# Patient Record
Sex: Male | Born: 1965 | Hispanic: No | Marital: Married | Smoking: Never smoker
Health system: Southern US, Community
[De-identification: ages and names within clinical notes are randomized; demographics above are authoritative.]

## PROBLEM LIST (undated history)

## (undated) DIAGNOSIS — N200 Calculus of kidney: Secondary | ICD-10-CM

## (undated) DIAGNOSIS — E119 Type 2 diabetes mellitus without complications: Secondary | ICD-10-CM

## (undated) DIAGNOSIS — I10 Essential (primary) hypertension: Secondary | ICD-10-CM

## (undated) DIAGNOSIS — E7800 Pure hypercholesterolemia, unspecified: Secondary | ICD-10-CM

## (undated) HISTORY — PX: BACK SURGERY: SHX140

---

## 2000-04-29 ENCOUNTER — Ambulatory Visit (HOSPITAL_BASED_OUTPATIENT_CLINIC_OR_DEPARTMENT_OTHER): Admission: RE | Admit: 2000-04-29 | Discharge: 2000-04-29 | Payer: Self-pay | Admitting: Urology

## 2002-02-18 ENCOUNTER — Encounter: Payer: Self-pay | Admitting: Internal Medicine

## 2002-02-18 ENCOUNTER — Encounter: Admission: RE | Admit: 2002-02-18 | Discharge: 2002-02-18 | Payer: Self-pay | Admitting: Internal Medicine

## 2006-07-05 ENCOUNTER — Ambulatory Visit: Payer: Self-pay | Admitting: Family Medicine

## 2006-07-26 ENCOUNTER — Ambulatory Visit: Payer: Self-pay | Admitting: Family Medicine

## 2006-08-24 ENCOUNTER — Ambulatory Visit: Payer: Self-pay | Admitting: Oncology

## 2006-10-03 ENCOUNTER — Ambulatory Visit: Payer: Self-pay | Admitting: Internal Medicine

## 2006-10-09 ENCOUNTER — Ambulatory Visit: Payer: Self-pay | Admitting: Oncology

## 2006-10-31 ENCOUNTER — Encounter (INDEPENDENT_AMBULATORY_CARE_PROVIDER_SITE_OTHER): Payer: Self-pay | Admitting: *Deleted

## 2006-10-31 ENCOUNTER — Ambulatory Visit: Payer: Self-pay | Admitting: Internal Medicine

## 2006-11-21 ENCOUNTER — Ambulatory Visit: Payer: Self-pay | Admitting: Internal Medicine

## 2007-03-26 ENCOUNTER — Ambulatory Visit: Payer: Self-pay | Admitting: Internal Medicine

## 2007-03-26 LAB — CONVERTED CEMR LAB
Basophils Absolute: 0.1 10*3/uL (ref 0.0–0.1)
Eosinophils Absolute: 0.2 10*3/uL (ref 0.0–0.6)
Eosinophils Relative: 2.5 % (ref 0.0–5.0)
Lymphocytes Relative: 31.3 % (ref 12.0–46.0)
MCHC: 32.6 g/dL (ref 30.0–36.0)
MCV: 81.9 fL (ref 78.0–100.0)
Monocytes Relative: 6.6 % (ref 3.0–11.0)
Neutro Abs: 4.8 10*3/uL (ref 1.4–7.7)
Platelets: 235 10*3/uL (ref 150–400)
WBC: 8.2 10*3/uL (ref 4.5–10.5)

## 2007-03-28 ENCOUNTER — Ambulatory Visit: Payer: Self-pay | Admitting: Internal Medicine

## 2007-05-26 ENCOUNTER — Emergency Department (HOSPITAL_COMMUNITY): Admission: EM | Admit: 2007-05-26 | Discharge: 2007-05-26 | Payer: Self-pay | Admitting: Emergency Medicine

## 2007-07-04 ENCOUNTER — Ambulatory Visit: Payer: Self-pay | Admitting: Family Medicine

## 2007-07-04 DIAGNOSIS — F528 Other sexual dysfunction not due to a substance or known physiological condition: Secondary | ICD-10-CM | POA: Insufficient documentation

## 2007-07-04 DIAGNOSIS — I10 Essential (primary) hypertension: Secondary | ICD-10-CM | POA: Insufficient documentation

## 2007-07-04 DIAGNOSIS — K5090 Crohn's disease, unspecified, without complications: Secondary | ICD-10-CM | POA: Insufficient documentation

## 2007-07-07 ENCOUNTER — Encounter (INDEPENDENT_AMBULATORY_CARE_PROVIDER_SITE_OTHER): Payer: Self-pay | Admitting: *Deleted

## 2007-07-07 LAB — CONVERTED CEMR LAB
BUN: 15 mg/dL (ref 6–23)
CO2: 32 meq/L (ref 19–32)
GFR calc Af Amer: 139 mL/min
Potassium: 4.3 meq/L (ref 3.5–5.1)

## 2007-07-16 ENCOUNTER — Ambulatory Visit: Payer: Self-pay | Admitting: Internal Medicine

## 2007-07-16 LAB — CONVERTED CEMR LAB
Basophils Absolute: 0.2 10*3/uL — ABNORMAL HIGH (ref 0.0–0.1)
HCT: 42.3 % (ref 39.0–52.0)
MCHC: 34.4 g/dL (ref 30.0–36.0)
Monocytes Relative: 3.9 % (ref 3.0–11.0)
RBC: 4.99 M/uL (ref 4.22–5.81)
RDW: 12.4 % (ref 11.5–14.6)

## 2007-10-06 ENCOUNTER — Encounter (INDEPENDENT_AMBULATORY_CARE_PROVIDER_SITE_OTHER): Payer: Self-pay | Admitting: General Surgery

## 2007-10-07 ENCOUNTER — Inpatient Hospital Stay (HOSPITAL_COMMUNITY): Admission: RE | Admit: 2007-10-07 | Discharge: 2007-10-08 | Payer: Self-pay | Admitting: General Surgery

## 2007-10-17 ENCOUNTER — Telehealth (INDEPENDENT_AMBULATORY_CARE_PROVIDER_SITE_OTHER): Payer: Self-pay | Admitting: *Deleted

## 2007-11-11 ENCOUNTER — Ambulatory Visit: Payer: Self-pay | Admitting: Internal Medicine

## 2007-12-15 ENCOUNTER — Ambulatory Visit: Payer: Self-pay | Admitting: Internal Medicine

## 2008-01-26 ENCOUNTER — Ambulatory Visit: Payer: Self-pay | Admitting: Family Medicine

## 2008-01-27 ENCOUNTER — Telehealth (INDEPENDENT_AMBULATORY_CARE_PROVIDER_SITE_OTHER): Payer: Self-pay | Admitting: *Deleted

## 2008-01-27 LAB — CONVERTED CEMR LAB
Cholesterol: 220 mg/dL (ref 0–200)
GFR calc Af Amer: 107 mL/min
GFR calc non Af Amer: 88 mL/min
Glucose, Bld: 116 mg/dL — ABNORMAL HIGH (ref 70–99)
Sodium: 142 meq/L (ref 135–145)
Total CHOL/HDL Ratio: 8.9
Triglycerides: 436 mg/dL (ref 0–149)

## 2008-01-28 ENCOUNTER — Encounter (INDEPENDENT_AMBULATORY_CARE_PROVIDER_SITE_OTHER): Payer: Self-pay | Admitting: *Deleted

## 2008-02-26 ENCOUNTER — Ambulatory Visit: Payer: Self-pay | Admitting: Internal Medicine

## 2008-02-26 DIAGNOSIS — R7309 Other abnormal glucose: Secondary | ICD-10-CM | POA: Insufficient documentation

## 2008-02-26 DIAGNOSIS — E785 Hyperlipidemia, unspecified: Secondary | ICD-10-CM | POA: Insufficient documentation

## 2008-03-01 ENCOUNTER — Encounter (INDEPENDENT_AMBULATORY_CARE_PROVIDER_SITE_OTHER): Payer: Self-pay | Admitting: *Deleted

## 2008-05-07 ENCOUNTER — Ambulatory Visit: Payer: Self-pay | Admitting: Internal Medicine

## 2008-05-12 LAB — CONVERTED CEMR LAB
Direct LDL: 130.6 mg/dL
HDL: 26 mg/dL — ABNORMAL LOW (ref >39.0)
Total CHOL/HDL Ratio: 8.1
Triglycerides: 265 mg/dL (ref 0–149)

## 2008-09-10 ENCOUNTER — Telehealth (INDEPENDENT_AMBULATORY_CARE_PROVIDER_SITE_OTHER): Payer: Self-pay | Admitting: *Deleted

## 2009-03-25 ENCOUNTER — Encounter: Admission: RE | Admit: 2009-03-25 | Discharge: 2009-03-25 | Payer: Self-pay | Admitting: Family Medicine

## 2009-03-31 ENCOUNTER — Encounter: Admission: RE | Admit: 2009-03-31 | Discharge: 2009-03-31 | Payer: Self-pay | Admitting: Family Medicine

## 2009-04-05 ENCOUNTER — Encounter: Admission: RE | Admit: 2009-04-05 | Discharge: 2009-04-05 | Payer: Self-pay | Admitting: Family Medicine

## 2009-05-06 ENCOUNTER — Ambulatory Visit (HOSPITAL_COMMUNITY): Admission: RE | Admit: 2009-05-06 | Discharge: 2009-05-06 | Payer: Self-pay | Admitting: Neurological Surgery

## 2010-06-19 IMAGING — CR DG CHEST 2V
2 series · 2 of 2 positions shown · non-contrast
Comparison: 10/03/2007

CLINICAL DATA: Preoperative evaluation

CHEST - 2 VIEW

[view not recorded (1 of 2)]
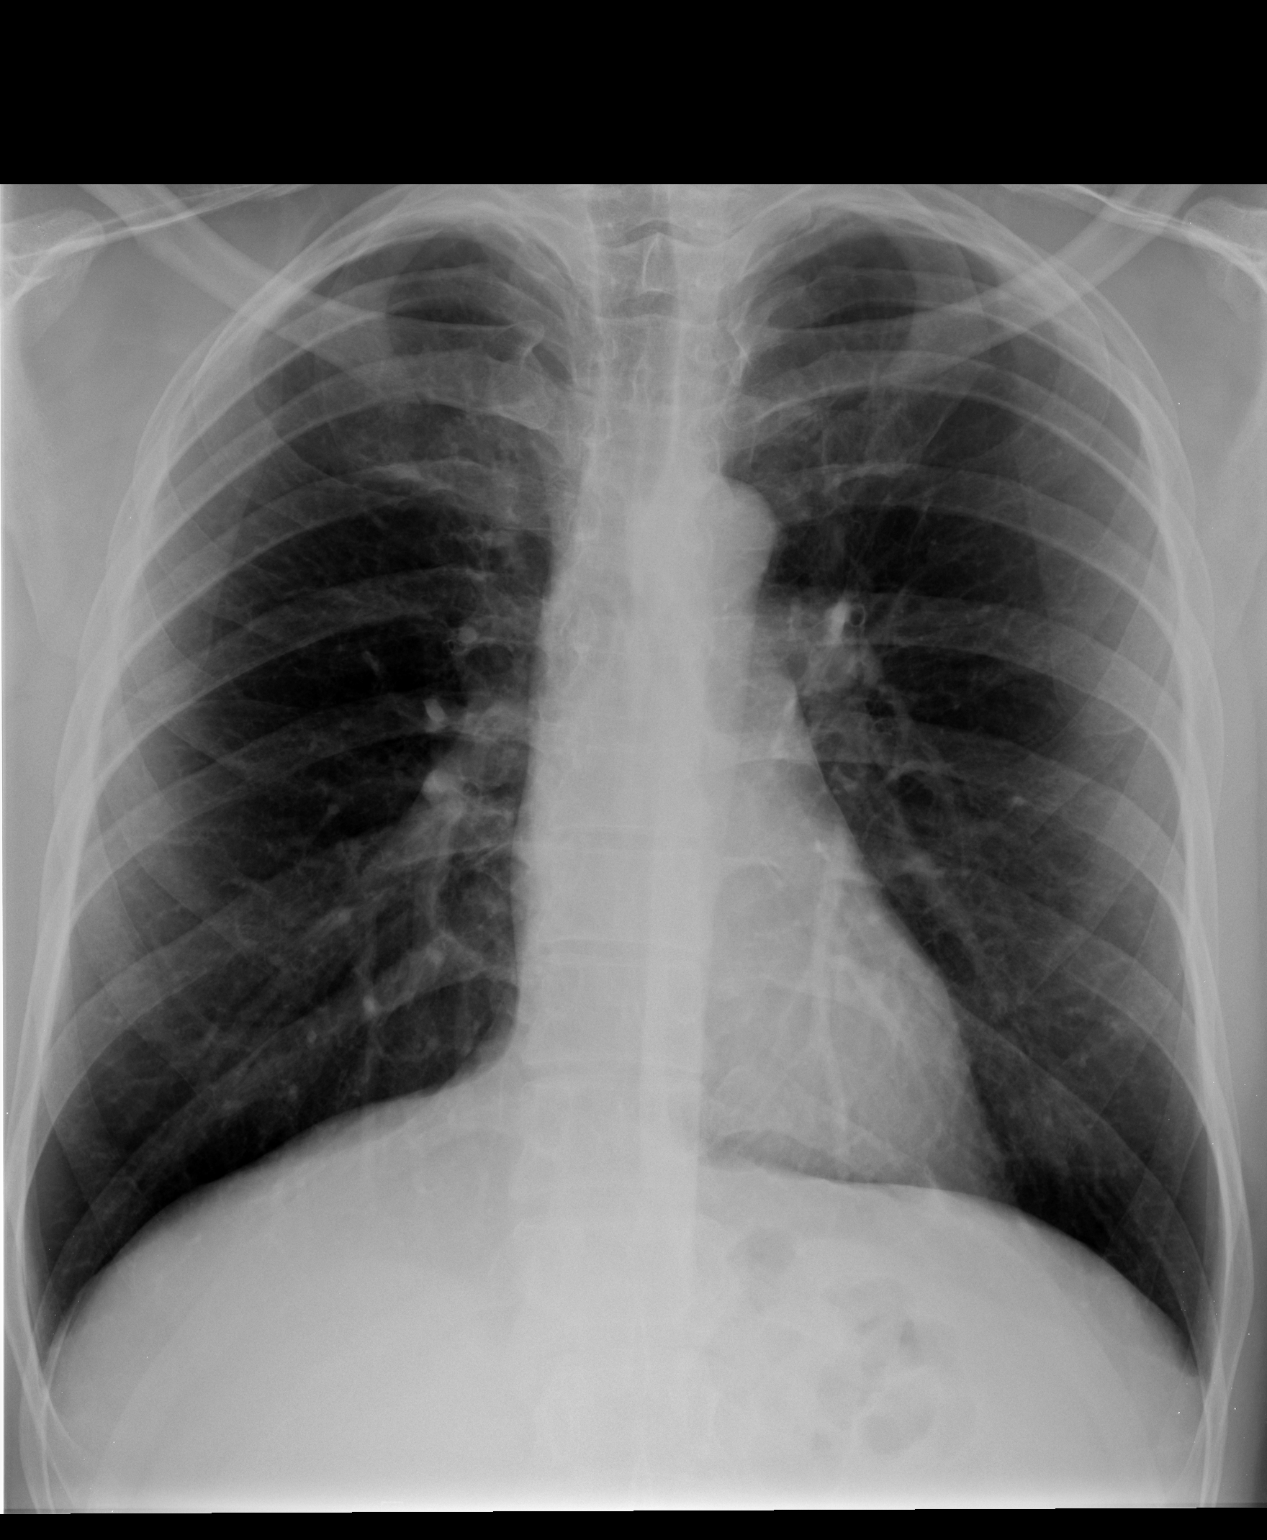

[view not recorded (2 of 2)]
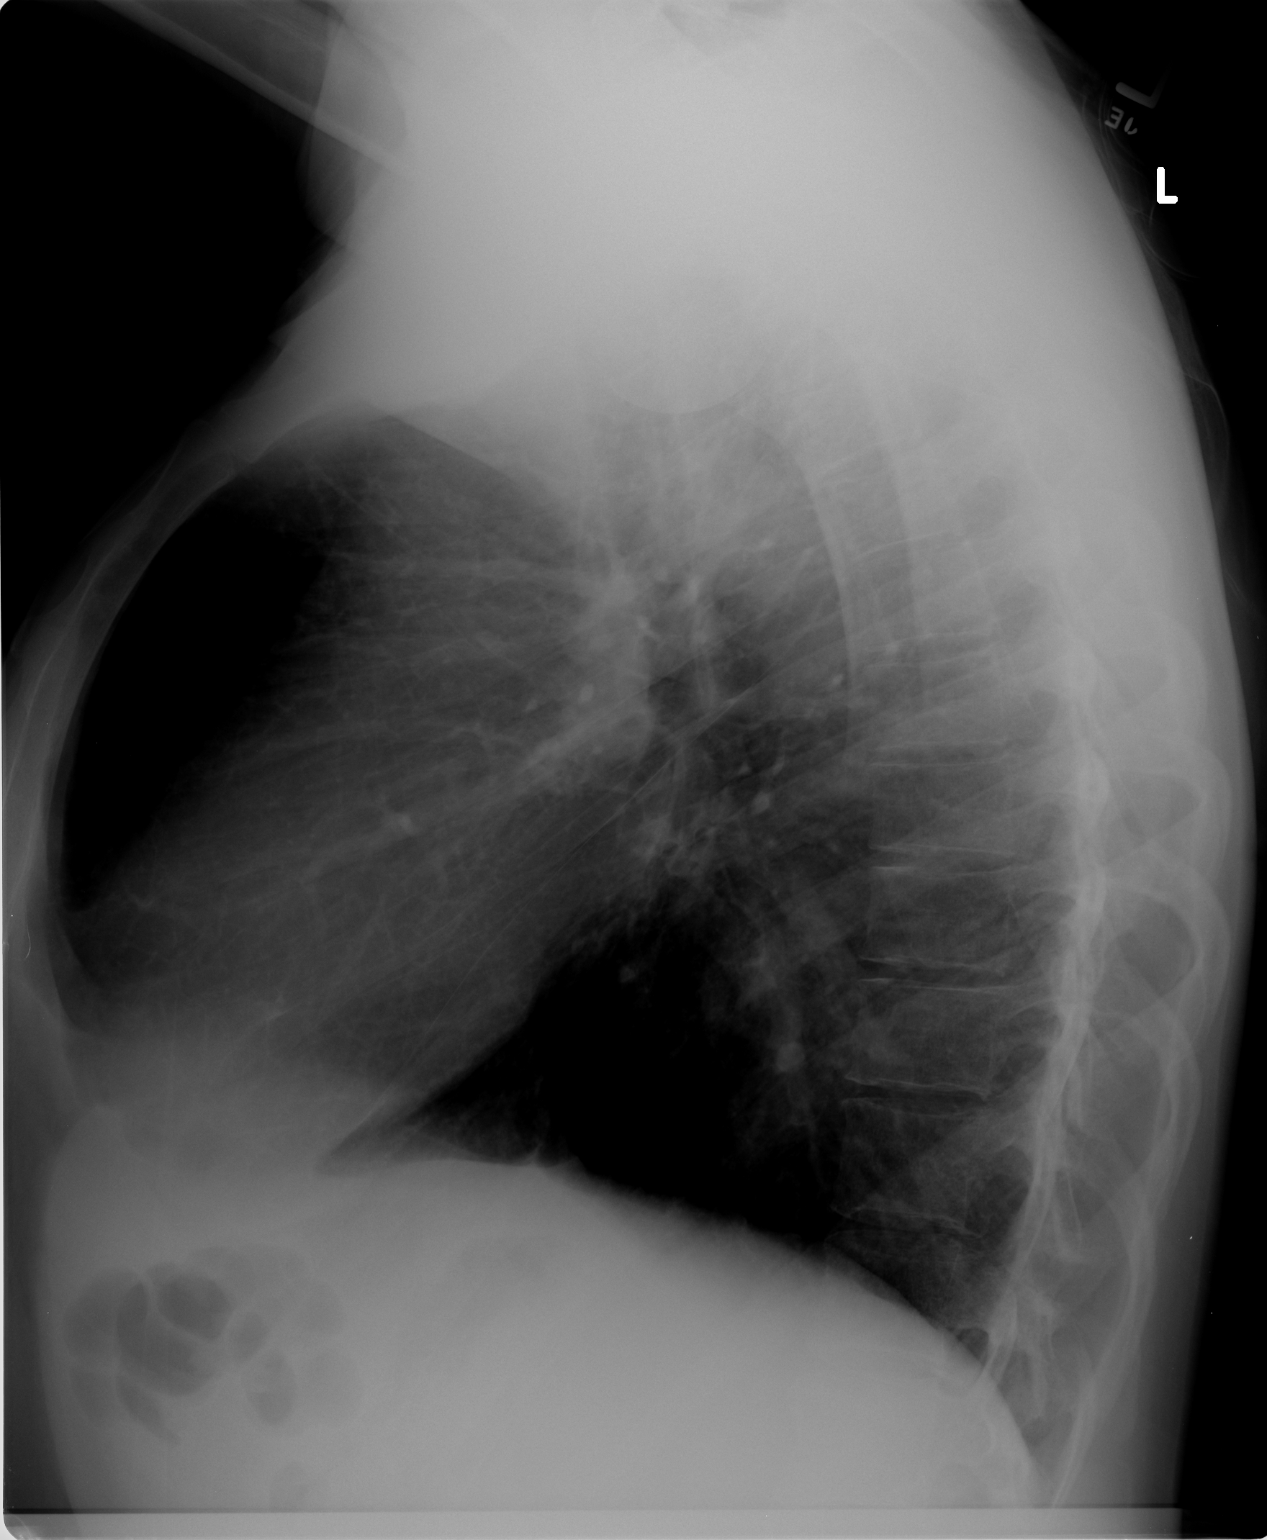

[2 of 2 positions shown; findings below may reference images not displayed]

FINDINGS: The lungs are clear.  Heart and mediastinal contours are
normal.  Osseous structures unremarkable.
IMPRESSION: No acute cardiopulmonary process.

## 2010-06-21 IMAGING — CR DG LUMBAR SPINE 1V
1 series · 1 of 1 positions shown · non-contrast
Comparison: MR lumbar spine 03/31/2009.

CLINICAL DATA: L4-5 microdiskectomy.

LUMBAR SPINE - 1 VIEW

[view not recorded]
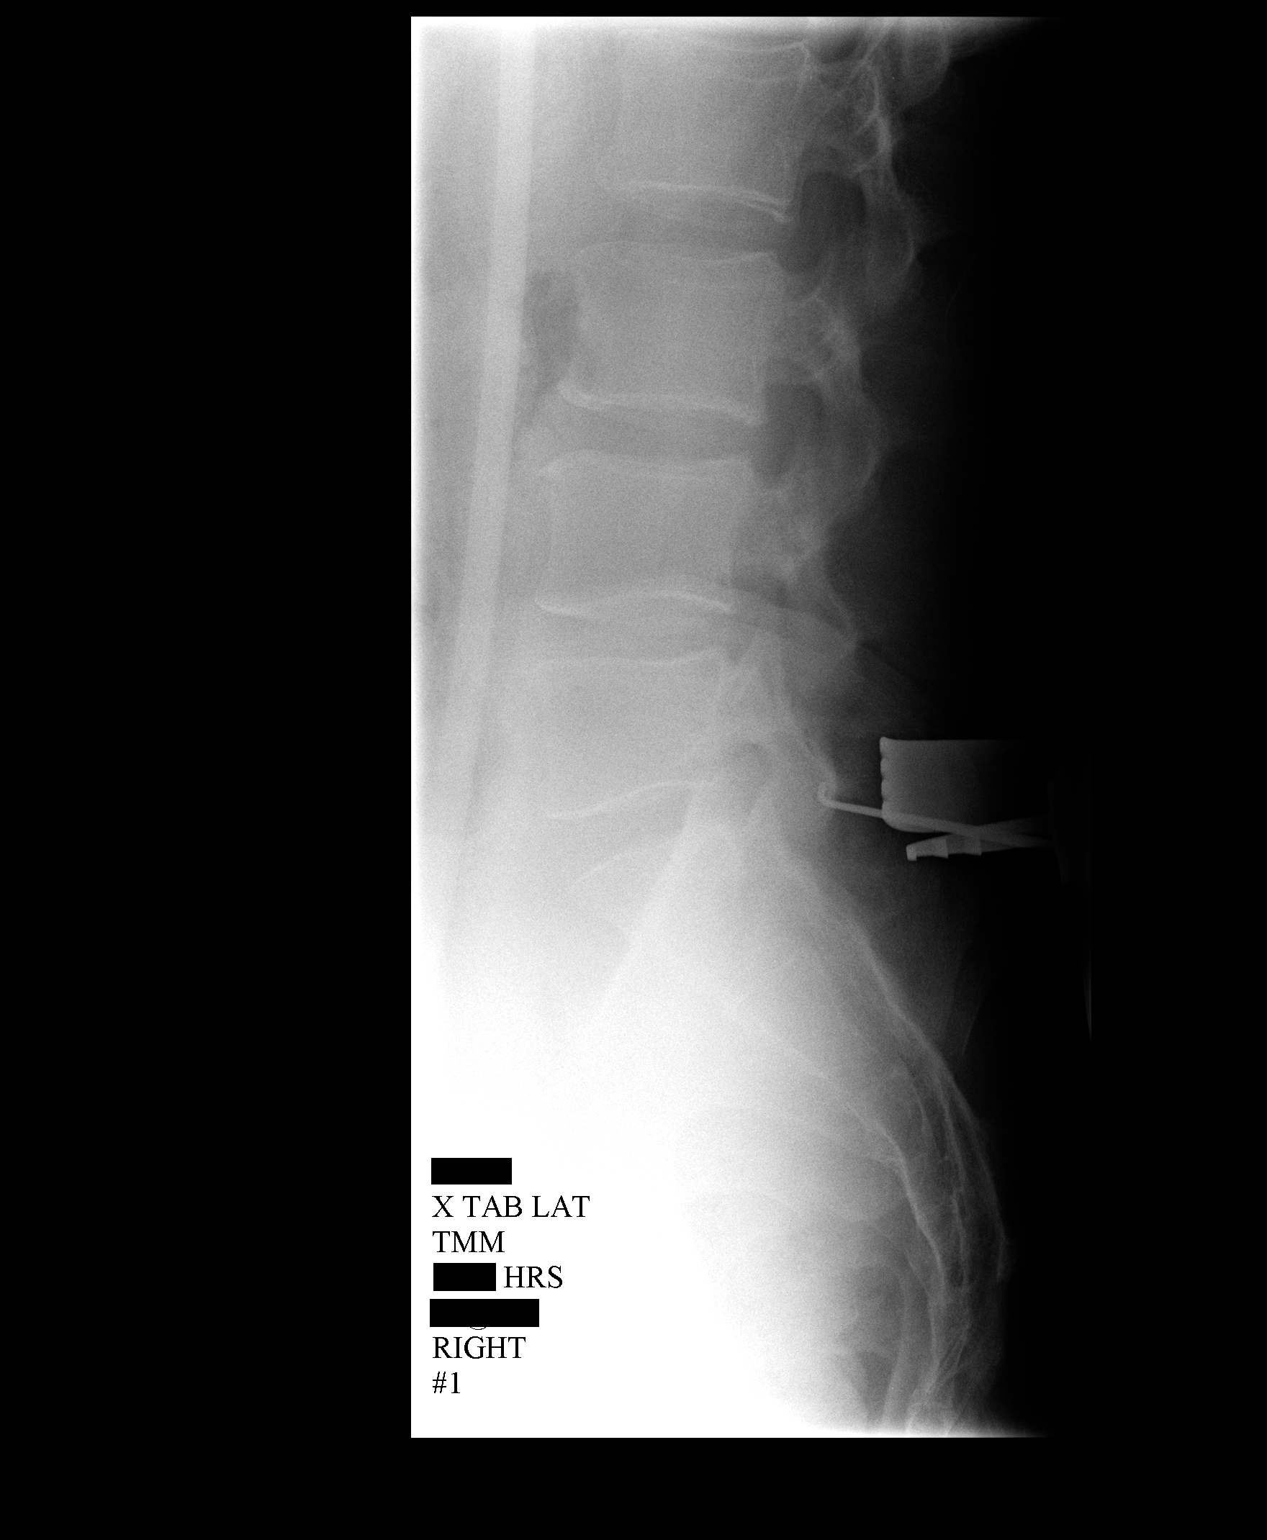

[1 of 1 positions shown; findings below may reference images not displayed]

FINDINGS: We are provided with a single intraoperative view of the
lumbar spine in the lateral projection.  Image demonstrates tissues
spreaders in place with metallic probe at the L5-S1 level.
IMPRESSION: Localization as above.

## 2011-02-26 LAB — CBC
MCV: 84.9 fL (ref 78.0–100.0)
Platelets: 232 10*3/uL (ref 150–400)
RDW: 13 % (ref 11.5–15.5)
WBC: 7 10*3/uL (ref 4.0–10.5)

## 2011-02-26 LAB — BASIC METABOLIC PANEL
BUN: 16 mg/dL (ref 6–23)
Chloride: 100 mEq/L (ref 96–112)
Creatinine, Ser: 0.81 mg/dL (ref 0.4–1.5)
GFR calc non Af Amer: >60 mL/min (ref >60)

## 2011-02-26 LAB — DIFFERENTIAL
Basophils Absolute: 0 10*3/uL (ref 0.0–0.1)
Eosinophils Absolute: 0.1 10*3/uL (ref 0.0–0.7)
Lymphs Abs: 2.7 10*3/uL (ref 0.7–4.0)
Neutrophils Relative %: 53 % (ref 43–77)

## 2011-02-26 LAB — PROTIME-INR
INR: 1 (ref 0.00–1.49)
Prothrombin Time: 13.3 seconds (ref 11.6–15.2)

## 2011-04-03 NOTE — Op Note (Signed)
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   POSTOPERATIVE DIAGNOSIS:  Right L4-5 lumbar disk herniation with right  L5 radiculopathy.   PROCEDURES:  Right hemilaminectomy, medial facetectomy, and foraminotomy  at L4-5 followed by microdiskectomy utilizing microscopic dissection.   SURGEON:  Tia Alert, MD   ASSISTANT:  Kathaleen Maser. Pool, MD   ANESTHESIA:  General endotracheal.   COMPLICATIONS:  None apparent.   INDICATIONS FOR PROCEDURE:  Bob Daniel is a 45 year old gentleman who  was referred with severe right leg pain and following L5 distribution.  He had a partial foot drop on the right.  He had an MRI which showed a  large right paracentral disk protrusion with compression of the right L5  nerve root, severe narrowing of the right lateral recess.  I recommended  a right L4-5 microdiskectomy.  He understood the risks, benefits,  expected outcome, and wished to proceed.   DESCRIPTION OF PROCEDURE:  The patient was taken to the operating room.  After induction of adequate generalized endotracheal anesthesia, he was  rolled in a prone position on the Wilson frame and all pressure points  were padded.  His lumbar region was prepped with DuraPrep and then  draped in the usual sterile fashion.  A 5 mL of local anesthesia was  then injected and a dorsal midline incision was made and carried down to  the lumbosacral fascia.  The fascia was opened, and the paraspinous  musculature was taken down in a subperiosteal fashion to expose what was  felt to be L4-5.  Intraoperative x-ray  confirmed this to be L5-S1; and  therefore, I moved up one level, took down the paraspinous musculature  and exposed one level up from the x-ray which was L4-5.  I then used the  high-speed drill and the Kerrison punches to perform a hemilaminectomy,  medial facetectomy, and foraminotomy at L4-5 on the right side.  The  underlying yellow ligament was removed in a piecemeal fashion to expose  the underlying dura and L5 nerve root.  I brought the operating  microscope.  The L5 nerve root was very tented and taut over a large  disk herniation.  I was able to gently pull the nerve root a couple of  millimeters medial and find the edge of a large free fragment, which was  fished out with a nerve hook.  Once this large free fragment came out,  then the nerve root relaxed easily as did the dura.  Therefore, Dr. Jordan Likes  gave gentle retraction on the nerve root with Bob Daniel retractor.  I  incised the disk space and performed a thorough intradiskal diskectomy  until  the medial annulus was sagging.  I then palpated with coronary  dilator into the midline and into the foramen, I felt no more  compression of the nerve root or to the thecal sac.  I irrigated with  saline solution, inspected once again, lined the dura with Gelfoam, then  closed the muscle and the fascia with 0 Vicryl, closed the subcutaneous  and subcuticular tissue with 2-0 and 3-0 Vicryl, and closed the skin  with Benzoin and Steri-Strips.  Drapes were  removed.  A sterile dressing was applied.  The patient was awakened from  general anesthesia and transferred to the recovery room in stable  condition.  At the end of the procedure, all sponge, needle, and  instrument counts were correct.      Tia Alert, MD  Electronically Signed     Tia Alert, MD  Electronically Signed    DSJ/MEDQ  D:  05/06/2009  T:  05/07/2009  Job:  [id number]

## 2011-04-03 NOTE — Assessment & Plan Note (Signed)
[facility]                         GASTROENTEROLOGY OFFICE NOTE   NAME:Bob Daniel, Bob Daniel                        MRN:          [id number]  DATE:07/16/2007                            DOB:          07/22/1966    PROBLEMS:  1. Crohn's disease of the small bowel.  2. Hemorrhoids/skin tags.  3. Iron deficiency anemia thought secondary to #1.  4. Hypertension.  5. Genitourinary surgery in the past.   INTERVAL HISTORY:  He returns, he is still using Anusol HC cream  intermittently but every time he moves his bowels he has quite a bit of  pain and tenderness and a lot of discomfort.  He is on the Entocort EC  and really can tell no difference.  No abdominal pain, no diarrhea,  though he had called in July and spoken to Dr. Marina Goodell indicating that he  was having some abdominal pain.   MEDICATIONS:  1. Micardis 80/25 mg daily.  2. Fiber supplement.  3. Iron sulfate twice daily.  4. Entocort 9 mg daily.  5. Anusol HC fairly regularly.   DRUG ALLERGIES:  NONE KNOWN.   Note, he has had a colonoscopy demonstrating an ileal ulcer and internal  hemorrhoids.  He had a capsule endoscopy showing multiple small bowel  ulcerations even in the proximal bowel though it was an incomplete exam  with prolonged gastric retention.   PHYSICAL EXAMINATION:  Shows some protruding hemorrhoids in the anoderm.  I think these look smaller.  In the past I had thought he had really  large, swollen tags perhaps related to Crohn's disease.   ASSESSMENT:  1. Crohn's disease of the small intestine.  2. Iron deficiency anemia secondary to #1.  3. Hemorrhoids.   PLAN:  1. Taper off the Entocort EC 3 mg a day for 2 weeks.  2. Start Pentasa 4 grams total a day, 2 grams b.i.d.  3. Recheck CBC.  4. Continue iron.  5. He has an appointment with Dr. Derrell Lolling, he had been scheduled by me      back earlier in the summer but he is going to see him next week.      I'd be interested to see  what Dr. Derrell Lolling thinks about his      hemorrhoidal disease.  In the past he had very swollen and      protruding lesions at the anoderm, I thought that he had the skin      tag lesions associated with Crohn's disease.  At any rate this is      better at this time.  We will see what Dr. Derrell Lolling says, excisional      hemorrhoidectomy is a potential option I suppose, will defer to Dr.      Jacinto Halim judgment however.  Perhaps hypertonic saline injection      would be appropriate as well.  He has quite a bit of symptoms from      these and perhaps they are simply hemorrhoids and not really      related to his Crohn's disease.   Note, I want to see [name] back in 2 months for reassessment.     Iva Boop, MD,FACG  Electronically Signed    CEG/MedQ  DD: 07/16/2007  DT: 07/17/2007  Job #: [id number]   cc:   Angelia Mould. Derrell Lolling, M.D.  Leanne Chang, M.D.

## 2011-04-03 NOTE — Assessment & Plan Note (Signed)
[facility]                         GASTROENTEROLOGY OFFICE NOTE   NAME:Klindt, CHRISTOS MIXSON                        MRN:          [id number]  DATE:03/26/2007                            DOB:          27-Feb-1966    CHIEF COMPLAINT:  Followup of iron deficiency anemia and ileal ulcer and  chronic occult blood loss.   HISTORY OF PRESENT ILLNESS:  Mr. Fennell returns.  His hemorrhoids are  quite symptomatic with a lot of bleeding and swelling.  He is on Anusol  HC cream with some help, but for about 1 hour to 1-1/2 hours after he  defecates in the morning, despite soft normal bowel movements, he has  some discomfort.  He does seem to be managing.  He had an ileal ulcer on  his colonoscopy on October 31, 2006.  He also had internal hemorrhoids.  I had recommended a capsule endoscopy.  He had hoped to get it done  before his deductible ended last year, but we could not accomplish that  because I was away.  He is now ready for further workup.  He has not had  his hemoglobin rechecked since he saw Dr. Jillyn Hidden B. Sherrill.  He denied  diarrhea or abdominal pain.   PROBLEMS:  1. Iron deficiency anemia with heme-positive stools, thought to be      chronic occult blood loss anemia.  2. Symptomatic internal hemorrhoids.  3. Ileal ulcer, etiology not clear, question Crohn's disease.  4. History of hypertension.  5. Genitourinary surgery in the past, question infertility surgery.   MEDICATIONS:  1. Micardis 80 mg/25 mg daily.  2. A fiber supplement daily.  3. Iron sulfate b.i.d.  4. Anusol HC cream.   ALLERGIES:  No known drug allergies.   PHYSICAL EXAMINATION:  VITAL SIGNS:  Weight 237 pounds, pulse 64, blood  pressure 130/72.  RECTAL:  Inspection of the anal area shows protruding hemorrhoids and  skin tags.   ASSESSMENT:  1. Chronic occult blood loss anemia.  2. Intestinal ulceration in the ileum.  I am suspicious of Crohn's      disease.  He has hemorrhoids  and he has these tag-like changes that      are very swollen and edematous that look like the anal changes      associated with Crohn's disease.  There does not seem to be any      perianal fistula or abscess or anything like that.   PLAN:  1. Check CBC with differential and C-reactive protein.  2. Small bowel capsule endoscopy will be ordered, to look for other      ulcers in the intestine, because I was only able to see the      terminal ileum.  The risks, benefits and indications, including the      risk of capsule retention and possible need for surgery or medical      treatment to alleviate an obstruction caused by that, were      explained to the patient.  3. Further plans pending the above.  If he does seem to have Crohn's  disease, then therapy of his Crohn's disease may help these      perianal changes.  4. Will also need to consider surgical evaluation of anorectal      problems.     Iva Boop, MD,FACG  Electronically Signed    CEG/MedQ  DD: 03/26/2007  DT: 03/26/2007  Job #: [id number]   cc:   Leighton Roach. Truett Perna, M.D.  Leanne Chang, M.D.

## 2011-04-03 NOTE — Assessment & Plan Note (Signed)
[facility]                         GASTROENTEROLOGY OFFICE NOTE   NAME:Bob, Daniel                        MRN:          [id number]  DATE:12/15/2007                            DOB:          Nov 17, 1966    CHIEF COMPLAINT:  Follow-up of Crohn's disease.   Bob Daniel has Crohn's disease of the small intestine identified by  previous colonoscopy and small-bowel capsule endoscopy.  He has  indicated that he cannot afford Pentasa therapy for this.  I talked to  him about the nature of diagnosis in Crohn's disease and we talked about  the possibility of inflammatory bowel disease profile testing but he  says he cannot afford that.  I had meant to get a price for TPMT  phenotype testing as well, but did not do so.  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   I also explained how his Crohn's disease is asymptomatic at this time  and that therapy is a bit tricky in that it would be used to prevent  problems down the line but he may not notice an effect of any type of  therapy.  We had talked about the possibility of immunomodulator therapy  such as 6-mercaptopurine.   His hemorrhoids are not symptomatic.  He is very happy over the  resolution of that.   PHYSICAL EXAMINATION:  VITAL SIGNS:  Weight 231 pounds, pulse 72, blood  pressure 144/90.   ASSESSMENT:  1. Crohn's disease of the small intestine.  This is asymptomatic      though I think it caused his iron-deficiency anemia previously.      Medical therapy is reasonable at this time.  He cannot afford      Pentasa he tells me, due to the $2000 maximum on his insurance for      medications.  The 6-mercaptopurine certainly would be a more      economical medication, though there is lab follow-up and a TPMT      phenotype.  He seems to have difficulty making a decision over this      at this time.  I will get the price on the TPMT phenotype.  I do  think that we need to have that prior to proceeding with therapy.      I think Bob Daniel has had difficulty understanding when I      explained the nature of Crohn's disease, etc., and admittedly it is      hard because he feels well and I have explained to him that Crohn's      disease is made based upon a diagnosis of a pattern of ulceration      and that they are other rare entities that could represent this, so      perhaps I have confused him a bit.  I did not intend to do so.  2. Hemorrhoids.  These are healing after surgery and are not a problem      anymore.   PLAN:  I will call him with these results and we  will go from there.  I  do think it is appropriate to have therapy and the reality of it is  aminosalicylate compounds have not proven to be a very effective  medication for Crohn's disease.    Bob Boop, MD,FACG  Electronically Signed   CEG/MedQ  DD: 12/15/2007  DT: 12/16/2007  Job #: [id number]

## 2011-04-03 NOTE — Assessment & Plan Note (Signed)
[facility]                         GASTROENTEROLOGY OFFICE NOTE   NAME:MUSLEHKeante, Urizar                        MRN:          [id number]  DATE:11/11/2007                            DOB:          [birth year]    CHIEF COMPLAINT:  Followup of Crohn's disease.   PROBLEMS:  See problem list from July 16, 2007.   INTERVAL HISTORY:  He had hemorrhoidectomy from Dr. Derrell Lolling and that is  going well though he still has a little bit of symptoms with pain.  The  procedure was on October 06, 2007.  He had an internal and external  hemorrhoidectomy.  He is having paying for Pentasa with his medication  plan; it is going to cost him probably thousands of dollars in a year  and that is really not going to work he tells me.  He does not really  have any symptoms from his Crohn's disease but he has had iron  deficiency anemia, which I think was related to that.  Small bowel  capsule endoscopy demonstrated multiple ulcerations, even in the  proximal bowel.  I explained to him that the rationale for treatment is  to prevent problems down the road as he does not appear to be that  symptomatic.  There are options that would include IBD profiling that  might give me an idea as to how aggressive his disease is, though there  are no guarantees of that, I explained.  He is willing to consider that  depending upon the cost.  I think, if we are not going to use Pentasa,  then 6-MP or azathioprine would make sense.  I reviewed the side effects  and potential problems with that which include leukopenia, lymphoma and  infectious risks, etc.  That seem to concern him somewhat.  He will be  educated with a handout and [employer] membership has been provided to the  patient as well.  There is no rush to do this but we will see how he  does.  Other options would be Entocort but that is a steroid and there  are long term problems with that as well and that is a cost issue, I  think, also.   His capsule endoscopy was performed in May, 2008.   I will look into the cost of the IBD profiling to see if we can help  sort out this a little bit better and we will discuss the options in the  next couple of weeks.  I have given him some Pentasa samples today.  Further plans pending that.     Iva Boop, MD,FACG  Electronically Signed    CEG/MedQ  DD: 11/11/2007  DT: 11/12/2007  Job #: [id number]   cc:   Angelia Mould. Derrell Lolling, M.D.  Leanne Chang, M.D.

## 2011-04-03 NOTE — Assessment & Plan Note (Signed)
[facility]                                 ON-CALL NOTE   NAME:MUSLEHColeson, Kant                          MRN:          [id number]  DATE:05/25/2007                            DOB:          1965/12/09    The caller is Mrs. Cretella.  The time of the call 7:45 p.m.   BODY OF THE MESSAGE:  Mrs. Gibas calls this evening regarding her  husband, Jaryn.  He apparently has a history of ileal Crohn disease as  identified on colonoscopy and capsule endoscopy.  He has been on  Entocort 9 mg daily for approximately 2 months.  He has not seen Dr.  Leone Payor for at least 2 months.  She calls this evening stating that he  is having cramping abdominal pain.  The patient had similar abdominal  pain last evening, which was relieved promptly with the passing of  flatus.  Today, he has eaten normal meals including hamburger for  dinner.  There is no nausea, vomiting, diarrhea, fever or bleeding.  He  has a history of high blood pressure.  No other issues.  He apparently  also has problems with hemorrhoids.  I recommended that they back off on  his diet to clear liquids only.  I also recommended that they take one  additional course of Entocort this evening.  Finally, I recommended that  they contact the office and set up follow-up with Dr. Leone Payor as soon as  possible.  However, in the interim, if he were to develop problems such  as fever or vomiting, bleeding or excruciating pain, I asked them to  proceed to the emergency room.     Wilhemina Bonito. Marina Goodell, MD  Electronically Signed    JNP/MedQ  DD: 05/25/2007  DT: 05/26/2007  Job #: [id number]

## 2011-04-03 NOTE — Op Note (Signed)
Bob Daniel, Bob Daniel NO.:  [id number]   MEDICAL RECORD NO.:  [id number]          PATIENT TYPE:  AMB   LOCATION:  DAY                          FACILITY:  [facility]   PHYSICIAN:  Angelia Mould. Derrell Lolling, M.D.DATE OF BIRTH:  [birth year]   DATE OF PROCEDURE:  10/06/2007  DATE OF DISCHARGE:                               OPERATIVE REPORT   PREOPERATIVE DIAGNOSIS:  Internal and external hemorrhoids.   POSTOPERATIVE DIAGNOSIS:  Internal and external hemorrhoids.   OPERATION PERFORMED:  Rigid proctoscopy, internal and external  hemorrhoidectomy.   SURGEON:  Dr. Claud Kelp.   OPERATIVE INDICATIONS:  This is a 45 year old Arabic gentleman who has a  longstanding history of Crohn disease in the small bowel.  He has had  colonoscopy by Dr. Stan Head showing an ulcer in the ileum and  internal hemorrhoids.  He has had capsule endoscopy showing  multiple  small bowel ulcerations in the proximal bowel.  In addition to this, he  has had problems with hemorrhoids for years.  He has daily burning and  prolapse and will have to put steroid cream on for a couple of hours.  This has been getting worse.  He sees a little bit of blood  occasionally.  On exam he has circumferential internal and external  hemorrhoids.  Both the internal and the external component are very  bulky.  He is interested in definitive intervention.  I felt that the  only way to manage both his internal and external component was a formal  excisional hemorrhoidectomy.   OPERATIVE TECHNIQUE:  The patient brought to operating room electively.  He had undergone a bowel prep at home.  General anesthesia was induced.  He was placed in the lithotomy position with rigid padded stirrups.  Perianal area and genitalia were prepped and draped in a sterile  fashion.   I gently dilated the anal sphincters over the space of three or 4  minutes until I could get four fingers in.  Rigid proctoscopy was  carried out to about  22 cm.  I saw no inflammatory change.  No ulcers,  no polyps.   I then inspected the hemorrhoids which were quite bulky.  I injected  0.5% Marcaine with epinephrine, 9 mL, mixed with Wydase 1 mL  circumferentially into the hemorrhoidal tissues, mostly the internal but  a little bit into the external component.  I massaged this for a few  minutes and we did get some shrinkage although they were still quite  bulky.   I then inserted the anoscope and inspected and found that the largest  hemorrhoids were right anterior and right posterior but also fairly  significant on the left lateral.   I resected the right posterior, then the right anterior, then the left  lateral.  The technique was basically similar so I will dictate it a  single time.   For each hemorrhoidal area, under direct vision I placed a figure-of-  eight transfixion suture of 2-0 chromic at the top of the hemorrhoidal  pile internally.  I then used electrocautery to incise the mucosa, the  dentate line, and a little bit of anoderm.  I kept this on traction and  excised all the hemorrhoidal tissue.  This did require some submucosal  dissection but visualization was excellent and we were able to avoid any  injury to the internal sphincters anywhere.  After each hemorrhoid was  excised, I then closed the mucosa with running locking suture of 2-0  chromic.  I was careful to approximate the dentate line and then  approximated the anoderm with a running suture as well and then tied  each suture.  This was done three separate times.  The anoscope was held  in place each time.  We had excellent mucosal bridge between each suture  line.  I inspected this for about 7 or 8 minutes.  There was no  bleeding.  I placed some Gelfoam gauze, rolled up into the anal canal  and left that in place for a few minutes and then removed it and there  was no bleeding whatsoever.  I felt that it would be best to leave and  all the packing out for  comfort.  External bandages were placed and the  patient taken to recovery room in stable condition.  Estimated blood  loss was about 20 mL or less.  Complications were none.  Sponge, needle  and instrument counts were correct.      Angelia Mould. Derrell Lolling, M.D.  Electronically Signed     HMI/MEDQ  D:  10/06/2007  T:  10/06/2007  Job:  [id number]   cc:   Iva Boop, MD,FACG  [facility]  [address] [place], [place] [address]

## 2011-04-06 NOTE — Assessment & Plan Note (Signed)
[facility]                         GASTROENTEROLOGY OFFICE NOTE   NAME:Bob Daniel, Bob Daniel                        MRN:          [id number]  DATE:11/21/2006                            DOB:          08/31/1966    CHIEF COMPLAINT:  Follow up of iron deficiency anemia and intestinal  ulcer.   His colonoscopy demonstrated some small ulcers in the terminal ileum.  He also had grade 2 internal hemorrhoids.  He continues to have problems  with swelling in the hemorrhoids despite using some ProctoCream HC.  With fiber supplements he is not having any straining to stool.  He has  seen Dr. Truett Perna and was recommended to have treatment with ferrous  sulfate but has not yet started it.  I called his wife while he was here  and explained things as well.  His colonoscopy was otherwise  unrevealing.   He does not take NSAIDS.   Weight 232 pounds, pulse 88, blood pressure 140/90, height 6 foot 4  inches.   ASSESSMENT:  1. Intestinal ulcers, could be Crohn's disease.  Etiology not entirely      clear, this is explained to the patient.  2. Chronic occult blood loss, though secondary to number 1.  3. Symptomatic internal hemorrhoids, they do not bleed too often so I      do not think they are a cause of his iron deficiency anemia.   PLAN:  1. Small bowel capsule endoscopy is recommended.  He needs to wait a      couple of weeks or so before he can do this, but we will schedule      it.  Risks, benefits, and indications including capsule retention      and possible surgery for obstruction are explained to the patient.  2. Go ahead and start his ferrous sulfate 325 mg b.i.d.  3. Anusol HC 25 mg suppositories at bedtime for 14 days, then as      needed for his hemorrhoids, consider hemorrhoidal ligation      depending upon the response.  4. If he only has a few ulcers in the distal terminal ileum, I do not      know that treating him for Crohn's disease is absolutely  necessary      at this time.  It may be that we need to follow this up with a      colonoscopy or capsule endoscopy depending upon what is seen.  If      he only has mild disease, then quitting smoking may prove helpful,      as that can aggravate Crohn's disease.  Further plans pending the      above.     Iva Boop, MD,FACG  Electronically Signed   CEG/MedQ  DD: 11/21/2006  DT: 11/21/2006  Job #: [id number]   cc:   Leighton Roach. Truett Perna, M.D.  Leanne Chang, M.D.

## 2011-04-06 NOTE — Assessment & Plan Note (Signed)
[facility]                           GASTROENTEROLOGY OFFICE NOTE   NAME:Brauner, Yash                          MRN:          [id number]  DATE:10/03/2006                            DOB:          May 14, 1966    REASON FOR CONSULTATION:  Iron-deficiency anemia.   ASSESSMENT:  A 45 year old [place] man who has had intermittent rectal  bleeding for a year or more.  It may be a little more frequent, but it is  not that much.  He has a microcytic anemia and Dr. Truett Perna has sent him  because his iron stores were low per his consultation note.  The possibility  of chronic occult gastrointestinal bleeding from colorectal neoplasia  certainly exists and should be excluded.  He seems to eat meat and things  like that, so malabsorption is unlikely, though I suspect celiac disease is  in the differential.   PLAN:  1. Schedule colonoscopy.  2. If that is unrevealing further testing could include celiac serologies,      and possible upper GI endoscopy.  He does not have any upper GI      symptoms.  Hemoccults may be needed in a period of time when he is not      having rectal bleeding if we determine that it is from hemorrhoids.   HISTORY:  A 45 year old [place] man with a history as above.  He saw Dr.  Truett Perna where his MCV was 71, his RDW was elevated, and his hemoglobin was  11.7.  Dr. Truett Perna indicated that his peripheral smear suggested iron  deficiency anemia, and that he would check iron studies and, if iron  deficiency was confirmed, that a gastroenterology consultation would be  undertaken.  I do not have the iron studies, but we have that information.   MEDICATIONS:  Stool softeners occasionally.   ALLERGIES:  No known drug allergies.   PAST MEDICAL HISTORY:  1. Hypertension, though he is not on any medications at this time.  2. Genitourinary surgery of unclear nature.  It sounds like infertility      surgery in 2002.   FAMILY HISTORY:   Diabetes in the mother and brothers.  Heart disease in his  father.  No colon cancer.   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  Married, 2 sons.  He smokes.  No other tobacco or drugs.   REVIEW OF SYSTEMS:  Positive for eye glasses.  All other systems negative.   PHYSICAL EXAM:  Well-developed, well-nourished Asian man.  Blood pressure 142/92.  (He does not know the name of his blood pressure  medication.)  Weight 231 pounds, pulse 80.  EYES:  Anicteric.  ENT:  Normal mouth, posterior pharynx.  NECK:  Supple.  No thyromegaly or mass.  CHEST:  Clear.  HEART:  S1, S2.  No murmurs or gallops.  ABDOMEN:  Soft, nontender.  No organomegaly or mass.  RECTAL:  Deferred.  EXTREMITIES:  No edema.  SKIN:  Warm, dry.  No acute rash.  NEURO/PSYCH:  He is alert and oriented x3.  LYMPH  NODES:  No neck or supraclavicular nodes.   I appreciate the opportunity to care for this patient.  However, I need the  labs and consultation note sent by Dr. Truett Perna.     Iva Boop, MD,FACG  Electronically Signed    CEG/MedQ  DD: 10/03/2006  DT: 10/03/2006  Job #: [id number]   cc:   Leighton Roach. Truett Perna, M.D.  Leanne Chang, M.D.

## 2011-04-06 NOTE — Assessment & Plan Note (Signed)
[facility]                         GASTROENTEROLOGY OFFICE NOTE   NAME:MUSLEHNalu, Troublefield                        MRN:          [id number]  DATE:                                      DOB:          22-Jul-1966    This is a telephone call.   I spoke to Mr. Parmer's wife.  Mr. Lashley has Crohn's disease of the  small intestine, which, as best I can tell, caused iron deficiency  anemia but no other symptoms.  I had him on Pentasa but that was too  costly.  We have entertained the possibility of 6-mercaptopurine  therapy, but after thinking this over, I think that since he is  asymptomatic a period of watchful waiting and observation makes sense.  I called today to explain this to the patient.  His wife was there.  I  explained it to her.  She will relay my decision or recommendation to  him, and he will call back if there are other questions.  I would like  to see him in late summer, early fall, sooner if there are symptoms.  He  is to watch for abdominal pain or diarrhea.   I just do not think the risk/benefit ratio of immunomodulators makes  sense at this time, and in reality, aminosalicylic acid compound such as  Pentasa are of questionable benefit in small bowel Crohn's disease  anyway.  Should we need to use 6-MP, TPMT phenotype testing would be  necessary.  The cash cost of that is $220 as of today.  There can be a  35% discount if he pays cash if it is not covered by insurance.     Iva Boop, MD,FACG  Electronically Signed    CEG/MedQ  DD: 12/18/2007  DT: 12/19/2007  Job #: [id number]   cc:   Leanne Chang, M.D.

## 2011-04-06 NOTE — Letter (Signed)
February 05, 2007    Bob Daniel  [address]  [place], [place] [address]   RE:  Bob Daniel, Bob Daniel  MRN:  [id number]  /  DOB:  September 09, 1966   Dear [name]:   I hope this letter finds you doing well.  I see that you asked for a  refill of your hemorrhoidal suppositories recently.  To date, you have  not completed the small bowel capsule endoscopy to try to sort out the  cause of the intestinal ulcers found at colonoscopy.   I know you are frustrated that we were not able to complete that before  the end of 2007 and avoid starting over a deductible payment, but I was  out of town and simply unable to get that scheduled for you before then.   I do remain concerned about your health situation and the cause of your  problems.  I would ask that you make an appointment to see me again so  we can review things and provide appropriate follow-up care.  We can  discuss testing options for this situation versus watchful waiting, but  I do think you need medical follow-up for these problems.    Sincerely,      Iva Boop, MD,FACG  Electronically Signed    CEG/MedQ  DD: 02/05/2007  DT: 02/06/2007  Job #: [id number]   CC:   Leanne Chang, M.D.  Leighton Roach. Truett Perna, M.D.

## 2011-04-06 NOTE — Assessment & Plan Note (Signed)
[facility]                         GASTROENTEROLOGY OFFICE NOTE   NAME:Bob Daniel, Bob Daniel                          MRN:          [id number]  DATE:04/07/2007                            DOB:          [birth year]    I called [name] wife.  He was unavailable.  His most recent CBC  was normal.  His C reactive protein was normal.  His small-bowel capsule  endoscopy showed multiple ulcers most consistent with Crohn's disease,  as did his colonoscopy, when it showed a single ileal ulcer.  He is  continuing to have problems with hemorrhoids and anal tag-like lesions.   Plan is:  1. Start Entocort EC 9 mg daily for his Crohn's disease at this time.  2. Metronidazole 500 mg t.i.d. for 2 weeks to see if that helps with      his anorectal issues.  I do not think he has any fistulae or      fissures there but he has the anal changes that I often see with      Crohn's.  3. A surgical referral will be made to see if we cannot have any      surgical treatment here.  He may need a biopsy of these lesions to      confirm that they are complicated hemorrhoids.  If it is indeed the      skin tag-like changes associated with Crohn's disease, I doubt      surgery would be appropriate, given possible complications from      that if this is assumed to be perianal changes from Crohn's.     Iva Boop, MD,FACG  Electronically Signed    CEG/MedQ  DD: 04/07/2007  DT: 04/07/2007  Job #: [id number]   cc:   Leanne Chang, M.D.  Leighton Roach. Truett Perna, M.D.

## 2011-08-28 LAB — BASIC METABOLIC PANEL
BUN: 16
CO2: 27
Chloride: 102
GFR calc non Af Amer: >60
Glucose, Bld: 147 — ABNORMAL HIGH
Potassium: 4.1

## 2011-08-28 LAB — HEMOGLOBIN AND HEMATOCRIT, BLOOD: Hemoglobin: 14.5

## 2011-09-04 LAB — CBC
HCT: 45.6
Hemoglobin: 15.1
MCHC: 33.2
MCV: 85.2
Platelets: 210
RDW: 13

## 2011-09-04 LAB — I-STAT 8, (EC8 V) (CONVERTED LAB)
BUN: 16
Bicarbonate: 29.2 — ABNORMAL HIGH
Glucose, Bld: 124 — ABNORMAL HIGH
Hemoglobin: 16.7
TCO2: 31
pH, Ven: 7.428 — ABNORMAL HIGH

## 2011-09-04 LAB — URINALYSIS, ROUTINE W REFLEX MICROSCOPIC
Bilirubin Urine: NEGATIVE
Hgb urine dipstick: NEGATIVE
Nitrite: NEGATIVE
Specific Gravity, Urine: 1.026
Urobilinogen, UA: 0.2
pH: 7.5

## 2011-09-04 LAB — DIFFERENTIAL
Basophils Absolute: 0
Eosinophils Absolute: 0
Eosinophils Relative: 0
Monocytes Absolute: 0.9 — ABNORMAL HIGH

## 2011-09-04 LAB — POCT I-STAT CREATININE: Operator id: 189501

## 2019-01-16 ENCOUNTER — Other Ambulatory Visit: Payer: Self-pay | Admitting: Family Medicine

## 2019-01-16 DIAGNOSIS — M7989 Other specified soft tissue disorders: Secondary | ICD-10-CM

## 2019-01-16 DIAGNOSIS — M79662 Pain in left lower leg: Secondary | ICD-10-CM

## 2020-03-14 ENCOUNTER — Ambulatory Visit: Payer: Self-pay | Attending: Internal Medicine

## 2020-03-14 DIAGNOSIS — Z23 Encounter for immunization: Secondary | ICD-10-CM

## 2020-03-14 NOTE — Progress Notes (Signed)
   Covid-19 Vaccination Clinic  Name:  QUY LOTTS    MRN: [id number] DOB: 05-13-1966  03/14/2020  Mr. Wogan was observed post Covid-19 immunization for 15 minutes without incident. He was provided with Vaccine Information Sheet and instruction to access the V-Safe system.   Mr. Roethler was instructed to call 911 with any severe reactions post vaccine: Marland Kitchen Difficulty breathing  . Swelling of face and throat  . A fast heartbeat  . A bad rash all over body  . Dizziness and weakness   Immunizations Administered    Name Date Dose VIS Date Route   Pfizer COVID-19 Vaccine 03/14/2020  5:09 PM 0.3 mL 01/13/2019 Intramuscular   Manufacturer: [employer], [employer]   Lot: [id number]   NDC: [address]-4

## 2020-12-22 DIAGNOSIS — E039 Hypothyroidism, unspecified: Secondary | ICD-10-CM | POA: Diagnosis not present

## 2020-12-22 DIAGNOSIS — I10 Essential (primary) hypertension: Secondary | ICD-10-CM | POA: Diagnosis not present

## 2020-12-22 DIAGNOSIS — E291 Testicular hypofunction: Secondary | ICD-10-CM | POA: Diagnosis not present

## 2020-12-22 DIAGNOSIS — E7800 Pure hypercholesterolemia, unspecified: Secondary | ICD-10-CM | POA: Diagnosis not present

## 2020-12-22 DIAGNOSIS — E119 Type 2 diabetes mellitus without complications: Secondary | ICD-10-CM | POA: Diagnosis not present

## 2021-03-06 DIAGNOSIS — Z20822 Contact with and (suspected) exposure to covid-19: Secondary | ICD-10-CM | POA: Diagnosis not present

## 2021-04-20 DIAGNOSIS — M545 Low back pain, unspecified: Secondary | ICD-10-CM | POA: Diagnosis not present

## 2021-05-27 ENCOUNTER — Encounter (HOSPITAL_COMMUNITY): Payer: Self-pay | Admitting: Emergency Medicine

## 2021-05-27 ENCOUNTER — Emergency Department (HOSPITAL_COMMUNITY)
Admission: EM | Admit: 2021-05-27 | Discharge: 2021-05-27 | Disposition: A | Discharge status: Home / Self Care | Payer: BLUE CROSS/BLUE SHIELD | Attending: Emergency Medicine | Admitting: Emergency Medicine

## 2021-05-27 ENCOUNTER — Other Ambulatory Visit: Payer: Self-pay

## 2021-05-27 ENCOUNTER — Emergency Department (HOSPITAL_COMMUNITY): Payer: BLUE CROSS/BLUE SHIELD

## 2021-05-27 DIAGNOSIS — R109 Unspecified abdominal pain: Secondary | ICD-10-CM | POA: Diagnosis not present

## 2021-05-27 DIAGNOSIS — I10 Essential (primary) hypertension: Secondary | ICD-10-CM | POA: Insufficient documentation

## 2021-05-27 DIAGNOSIS — R1032 Left lower quadrant pain: Secondary | ICD-10-CM | POA: Diagnosis not present

## 2021-05-27 DIAGNOSIS — E119 Type 2 diabetes mellitus without complications: Secondary | ICD-10-CM | POA: Diagnosis not present

## 2021-05-27 DIAGNOSIS — R1110 Vomiting, unspecified: Secondary | ICD-10-CM | POA: Diagnosis not present

## 2021-05-27 DIAGNOSIS — N281 Cyst of kidney, acquired: Secondary | ICD-10-CM | POA: Diagnosis not present

## 2021-05-27 DIAGNOSIS — K7689 Other specified diseases of liver: Secondary | ICD-10-CM | POA: Diagnosis not present

## 2021-05-27 DIAGNOSIS — N1330 Unspecified hydronephrosis: Secondary | ICD-10-CM | POA: Diagnosis not present

## 2021-05-27 DIAGNOSIS — Q613 Polycystic kidney, unspecified: Secondary | ICD-10-CM

## 2021-05-27 DIAGNOSIS — R1030 Lower abdominal pain, unspecified: Secondary | ICD-10-CM | POA: Diagnosis not present

## 2021-05-27 DIAGNOSIS — N189 Chronic kidney disease, unspecified: Secondary | ICD-10-CM | POA: Diagnosis not present

## 2021-05-27 DIAGNOSIS — N200 Calculus of kidney: Secondary | ICD-10-CM | POA: Diagnosis not present

## 2021-05-27 HISTORY — DX: Essential (primary) hypertension: I10

## 2021-05-27 HISTORY — DX: Calculus of kidney: N20.0

## 2021-05-27 HISTORY — DX: Pure hypercholesterolemia, unspecified: E78.00

## 2021-05-27 HISTORY — DX: Type 2 diabetes mellitus without complications: E11.9

## 2021-05-27 LAB — COMPREHENSIVE METABOLIC PANEL
ALT: 18 U/L (ref 0–44)
AST: 22 U/L (ref 15–41)
Albumin: 3.8 g/dL (ref 3.5–5.0)
Alkaline Phosphatase: 67 U/L (ref 38–126)
Anion gap: 12 (ref 5–15)
BUN: 17 mg/dL (ref 6–20)
CO2: 20 mmol/L — ABNORMAL LOW (ref 22–32)
Calcium: 9.1 mg/dL (ref 8.9–10.3)
Chloride: 101 mmol/L (ref 98–111)
Creatinine, Ser: 1.1 mg/dL (ref 0.61–1.24)
GFR, Estimated: >60 mL/min (ref >60)
Glucose, Bld: 179 mg/dL — ABNORMAL HIGH (ref 70–99)
Potassium: 4.2 mmol/L (ref 3.5–5.1)
Sodium: 133 mmol/L — ABNORMAL LOW (ref 135–145)
Total Bilirubin: 0.9 mg/dL (ref 0.3–1.2)
Total Protein: 6.8 g/dL (ref 6.5–8.1)

## 2021-05-27 LAB — URINALYSIS, ROUTINE W REFLEX MICROSCOPIC
Bilirubin Urine: NEGATIVE
Glucose, UA: NEGATIVE mg/dL
Ketones, ur: NEGATIVE mg/dL
Leukocytes,Ua: NEGATIVE
Nitrite: NEGATIVE
Protein, ur: NEGATIVE mg/dL
RBC / HPF: >50 RBC/hpf — ABNORMAL HIGH (ref 0–5)
Specific Gravity, Urine: 1.023 (ref 1.005–1.030)
pH: 5 (ref 5.0–8.0)

## 2021-05-27 LAB — LIPASE, BLOOD: Lipase: 24 U/L (ref 11–51)

## 2021-05-27 LAB — CBC WITH DIFFERENTIAL/PLATELET
Abs Immature Granulocytes: 0.05 10*3/uL (ref 0.00–0.07)
Basophils Absolute: 0.1 10*3/uL (ref 0.0–0.1)
Basophils Relative: 0 %
Eosinophils Absolute: 0 10*3/uL (ref 0.0–0.5)
Eosinophils Relative: 0 %
HCT: 46.1 % (ref 39.0–52.0)
Hemoglobin: 14.6 g/dL (ref 13.0–17.0)
Immature Granulocytes: 0 %
Lymphocytes Relative: 15 %
Lymphs Abs: 2 10*3/uL (ref 0.7–4.0)
MCH: 25.8 pg — ABNORMAL LOW (ref 26.0–34.0)
MCHC: 31.7 g/dL (ref 30.0–36.0)
MCV: 81.4 fL (ref 80.0–100.0)
Monocytes Absolute: 0.8 10*3/uL (ref 0.1–1.0)
Monocytes Relative: 6 %
Neutro Abs: 10.7 10*3/uL — ABNORMAL HIGH (ref 1.7–7.7)
Neutrophils Relative %: 79 %
Platelets: 236 10*3/uL (ref 150–400)
RBC: 5.66 MIL/uL (ref 4.22–5.81)
RDW: 14 % (ref 11.5–15.5)
WBC: 13.7 10*3/uL — ABNORMAL HIGH (ref 4.0–10.5)
nRBC: 0 % (ref 0.0–0.2)

## 2021-05-27 MED ORDER — HYDROCODONE-ACETAMINOPHEN 5-325 MG PO TABS: 1.0000 | ORAL_TABLET | ORAL | 0 refills | Status: AC | PRN

## 2021-05-27 MED ORDER — HYDROMORPHONE HCL 1 MG/ML IJ SOLN
0.5000 mg | Freq: Once | INTRAMUSCULAR | Status: AC
  Administered 2021-05-27: 0.5 mg via INTRAVENOUS
  Filled 2021-05-27: qty 1

## 2021-05-27 MED ORDER — ONDANSETRON HCL 4 MG/2ML IJ SOLN
4.0000 mg | Freq: Once | INTRAMUSCULAR | Status: AC
  Administered 2021-05-27: 4 mg via INTRAVENOUS
  Filled 2021-05-27: qty 2

## 2021-05-27 MED ORDER — SODIUM CHLORIDE 0.9 % IV BOLUS
1000.0000 mL | Freq: Once | INTRAVENOUS | Status: AC
  Administered 2021-05-27: 1000 mL via INTRAVENOUS

## 2021-05-27 MED ORDER — ONDANSETRON 4 MG PO TBDP: 4.0000 mg | ORAL_TABLET | Freq: Three times a day (TID) | ORAL | 0 refills | Status: AC | PRN

## 2021-05-27 MED ORDER — KETOROLAC TROMETHAMINE 15 MG/ML IJ SOLN
15.0000 mg | Freq: Once | INTRAMUSCULAR | Status: AC
  Administered 2021-05-27: 15 mg via INTRAVENOUS
  Filled 2021-05-27: qty 1

## 2021-05-27 NOTE — ED Provider Notes (Signed)
[facility] Provider Note   CSN: [id number] Arrival date & time: 05/27/21  1337     History Chief Complaint  Patient presents with   Flank Pain    Bob Daniel is a 55 y.o. male.  55 year old male with history of diabetes, hyperlipidemia, hypertension, kidney stone (10 years ago, past Surgical intervention), presents with complaint of left flank pain, radiates to left lower quadrant onset 3:30 AM today, woke him from his sleep.  Reports vomiting x2 episodes with difficulty voiding, denies hematuria or dysuria.  Reports last bowel meant last night.  No other complaints or concerns today.  Took 2 ibuprofen without improvement in pain, nothing worsens pain.      Past Medical History:  Diagnosis Date   Diabetes mellitus without complication (HCC)    High cholesterol    Hypertension    Kidney stone     Patient Active Problem List   Diagnosis Date Noted   HYPERLIPIDEMIA 02/26/2008   HYPERGLYCEMIA 02/26/2008   ERECTILE DYSFUNCTION 07/04/2007   HYPERTENSION 07/04/2007   CROHN'S DISEASE 07/04/2007    Past Surgical History:  Procedure Laterality Date   BACK SURGERY         No family history on file.  Social History   Tobacco Use   Smoking status: Never   Smokeless tobacco: Never  Substance Use Topics   Alcohol use: Never   Drug use: Never    Home Medications Prior to Admission medications   Medication Sig Start Date End Date Taking? Authorizing Provider  HYDROcodone-acetaminophen (NORCO/VICODIN) 5-325 MG tablet Take 1 tablet by mouth every 4 (four) hours as needed. 05/27/21  Yes Jeannie Fend, PA-C  ondansetron (ZOFRAN ODT) 4 MG disintegrating tablet Take 1 tablet (4 mg total) by mouth every 8 (eight) hours as needed for nausea or vomiting. 05/27/21  Yes Jeannie Fend, PA-C    Allergies    Patient has no allergy information on record.  Review of Systems   Review of Systems  Constitutional:  Negative for chills and  fever.  Respiratory:  Negative for shortness of breath.   Cardiovascular:  Negative for chest pain.  Gastrointestinal:  Positive for abdominal pain, constipation, nausea and vomiting. Negative for abdominal distention and diarrhea.  Genitourinary:  Positive for difficulty urinating and flank pain. Negative for dysuria, frequency, hematuria and testicular pain.  Musculoskeletal:  Negative for arthralgias and myalgias.  Skin:  Negative for rash and wound.  Allergic/Immunologic: Positive for immunocompromised state.  Neurological:  Negative for weakness.  Hematological:  Negative for adenopathy.  Psychiatric/Behavioral:  Negative for confusion.   All other systems reviewed and are negative.  Physical Exam Updated Vital Signs BP (!) 174/91 (BP Location: Right Arm)   Pulse 60   Temp 97.9 F (36.6 C)   Resp 16   SpO2 100%   Physical Exam Vitals and nursing note reviewed.  Constitutional:      General: He is not in acute distress.    Appearance: He is well-developed. He is not diaphoretic.  HENT:     Head: Normocephalic and atraumatic.  Cardiovascular:     Rate and Rhythm: Normal rate and regular rhythm.     Pulses: Normal pulses.     Heart sounds: Normal heart sounds.  Pulmonary:     Effort: Pulmonary effort is normal.     Breath sounds: Normal breath sounds.  Abdominal:     Palpations: Abdomen is soft.     Tenderness: There is abdominal tenderness in  the left lower quadrant. There is no right CVA tenderness or left CVA tenderness.  Musculoskeletal:     Right lower leg: No edema.     Left lower leg: No edema.  Skin:    General: Skin is warm and dry.     Findings: No erythema or rash.  Neurological:     Mental Status: He is alert and oriented to person, place, and time.  Psychiatric:        Behavior: Behavior normal.    ED Results / Procedures / Treatments   Labs (all labs ordered are listed, but only abnormal results are displayed) Labs Reviewed  URINALYSIS, ROUTINE W  REFLEX MICROSCOPIC - Abnormal; Notable for the following components:      Result Value   APPearance HAZY (*)    Hgb urine dipstick MODERATE (*)    RBC / HPF >50 (*)    Bacteria, UA RARE (*)    All other components within normal limits  CBC WITH DIFFERENTIAL/PLATELET - Abnormal; Notable for the following components:   WBC 13.7 (*)    MCH 25.8 (*)    Neutro Abs 10.7 (*)    All other components within normal limits  COMPREHENSIVE METABOLIC PANEL - Abnormal; Notable for the following components:   Sodium 133 (*)    CO2 20 (*)    Glucose, Bld 179 (*)    All other components within normal limits  LIPASE, BLOOD    EKG None  Radiology CT RENAL STONE STUDY  Result Date: 05/27/2021 CLINICAL DATA:  Onset left flank pain at 3:30 a.m. last night. EXAM: CT ABDOMEN AND PELVIS WITHOUT CONTRAST TECHNIQUE: Multidetector CT imaging of the abdomen and pelvis was performed following the standard protocol without IV contrast. COMPARISON:  None. FINDINGS: Lower chest: Lung bases clear.  No pleural or pericardial effusion. Hepatobiliary: Innumerable small low attenuating lesions throughout the liver are likely tiny cysts. Liver is otherwise negative. The gallbladder is almost completely decompressed. No stones or evidence of cholecystitis. Biliary tree negative. Pancreas: Unremarkable. No pancreatic ductal dilatation or surrounding inflammatory changes. Spleen: Normal in size without focal abnormality. Adrenals/Urinary Tract: The adrenal glands are negative. The patient has innumerable bilateral renal cysts and both kidneys are enlarged, both much worse on the left. There is stranding about the inferior pole the left kidney and ureter and there is moderate left hydronephrosis. No ureteral stone is identified. 2-3 nonobstructing stones are seen in both kidneys. Largest stone is in the lower pole the left kidney measuring 1.5 cm. The bladder is negative. Stomach/Bowel: Stomach is within normal limits. Appendix  appears normal. No evidence of bowel wall thickening, distention, or inflammatory changes. Vascular/Lymphatic: Aortic atherosclerosis. No enlarged abdominal or pelvic lymph nodes. Reproductive: Prostate is unremarkable. Other: Small fat containing umbilical hernia noted. Musculoskeletal: No acute or focal abnormality. IMPRESSION: Findings consistent with polycystic kidneys bilaterally. Multiple small cysts in the liver also noted. Stranding about the left kidney and ureter and moderate left hydronephrosis. No obstructing stone is identified. Question recent stone passage, ureteritis and/or pyelonephritis. Bilateral nonobstructing renal stones. Aortic Atherosclerosis (ICD10-I70.0). Electronically Signed   By: Drusilla Kanner M.D.   On: 05/27/2021 15:12    Procedures Procedures   Medications Ordered in ED Medications  ondansetron (ZOFRAN) injection 4 mg (4 mg Intravenous Given 05/27/21 1443)  HYDROmorphone (DILAUDID) injection 0.5 mg (0.5 mg Intravenous Given 05/27/21 1444)  sodium chloride 0.9 % bolus 1,000 mL (1,000 mLs Intravenous New Bag/Given 05/27/21 1555)  ketorolac (TORADOL) 15 MG/ML injection 15 mg (  15 mg Intravenous Given 05/27/21 1556)    ED Course  I have reviewed the triage vital signs and the nursing notes.  Pertinent labs & imaging results that were available during my care of the patient were reviewed by me and considered in my medical decision making (see chart for details).  Clinical Course as of 05/27/21 1610  Sat May 27, 2021  524 55 year old male with history of kidney stones presents with left flank pain with nausea and vomiting onset early this morning.  On exam, no CVA tenderness, does have mild tenderness in the left lower quadrant. Lab work with mild leukocytosis white count 13.7, CMP with normal renal function, urinalysis with moderate hemoglobin, leukocyte Estrace negative.  Lipase within normal notes. CT with findings of polycystic kidney disease, dilated left ureter  suggesting recently passed left-sided stone. Discussed results patient, given Dilaudid and Zofran for pain, given dose of Toradol.  Plan is to follow-up with PCP new diagnosis polycystic kidney disease.  Given return to ER precautions.  Give prescription for Norco and Zofran. [LM]    Clinical Course User Index [LM] [facility]   MDM Rules/Calculators/A&P                           Final Clinical Impression(s) / ED Diagnoses Final diagnoses:  Left flank pain  Polycystic kidney disease    Rx / DC Orders ED Discharge Orders          Ordered    HYDROcodone-acetaminophen (NORCO/VICODIN) 5-325 MG tablet  Every 4 hours PRN        05/27/21 1607    ondansetron (ZOFRAN ODT) 4 MG disintegrating tablet  Every 8 hours PRN        05/27/21 1607             Jeannie Fend, PA-C 05/27/21 1610    Mancel Bale, MD 05/27/21 2048

## 2021-05-27 NOTE — Discharge Instructions (Addendum)
Your CT scan shows that you have passed a stone recently. This may be causing your pain today. Recommend follow up with your doctor for finding of polycystic kidney disease.  Take Norco as needed as prescribed for pain.  Do not drive or operate machinery taking Norco.  This medication can cause constipation, take Colace and MiraLAX as needed. Take Zofran as needed as prescribed for nausea and vomiting.

## 2021-05-27 NOTE — ED Triage Notes (Signed)
Pt reports L flank pain since 3:30am with dysuria and vomited x 2. History of kidney stone 10 years ago.  Denies nausea.

## 2021-06-22 DIAGNOSIS — E039 Hypothyroidism, unspecified: Secondary | ICD-10-CM | POA: Diagnosis not present

## 2021-06-22 DIAGNOSIS — Z125 Encounter for screening for malignant neoplasm of prostate: Secondary | ICD-10-CM | POA: Diagnosis not present

## 2021-06-22 DIAGNOSIS — E291 Testicular hypofunction: Secondary | ICD-10-CM | POA: Diagnosis not present

## 2021-06-22 DIAGNOSIS — E119 Type 2 diabetes mellitus without complications: Secondary | ICD-10-CM | POA: Diagnosis not present

## 2021-06-22 DIAGNOSIS — E7800 Pure hypercholesterolemia, unspecified: Secondary | ICD-10-CM | POA: Diagnosis not present

## 2021-06-22 DIAGNOSIS — I10 Essential (primary) hypertension: Secondary | ICD-10-CM | POA: Diagnosis not present

## 2021-06-30 DIAGNOSIS — K219 Gastro-esophageal reflux disease without esophagitis: Secondary | ICD-10-CM | POA: Diagnosis not present

## 2021-06-30 DIAGNOSIS — Q613 Polycystic kidney, unspecified: Secondary | ICD-10-CM | POA: Diagnosis not present

## 2021-06-30 DIAGNOSIS — E119 Type 2 diabetes mellitus without complications: Secondary | ICD-10-CM | POA: Diagnosis not present

## 2021-06-30 DIAGNOSIS — I10 Essential (primary) hypertension: Secondary | ICD-10-CM | POA: Diagnosis not present

## 2021-08-17 DIAGNOSIS — M62830 Muscle spasm of back: Secondary | ICD-10-CM | POA: Diagnosis not present

## 2021-08-17 DIAGNOSIS — M545 Low back pain, unspecified: Secondary | ICD-10-CM | POA: Diagnosis not present

## 2021-08-17 DIAGNOSIS — M9903 Segmental and somatic dysfunction of lumbar region: Secondary | ICD-10-CM | POA: Diagnosis not present

## 2021-08-17 DIAGNOSIS — M9901 Segmental and somatic dysfunction of cervical region: Secondary | ICD-10-CM | POA: Diagnosis not present

## 2021-08-22 DIAGNOSIS — M9903 Segmental and somatic dysfunction of lumbar region: Secondary | ICD-10-CM | POA: Diagnosis not present

## 2021-08-22 DIAGNOSIS — M9901 Segmental and somatic dysfunction of cervical region: Secondary | ICD-10-CM | POA: Diagnosis not present

## 2021-08-22 DIAGNOSIS — M62830 Muscle spasm of back: Secondary | ICD-10-CM | POA: Diagnosis not present

## 2021-08-22 DIAGNOSIS — M545 Low back pain, unspecified: Secondary | ICD-10-CM | POA: Diagnosis not present

## 2021-08-31 DIAGNOSIS — R109 Unspecified abdominal pain: Secondary | ICD-10-CM | POA: Diagnosis not present

## 2021-08-31 DIAGNOSIS — K625 Hemorrhage of anus and rectum: Secondary | ICD-10-CM | POA: Diagnosis not present

## 2021-08-31 DIAGNOSIS — K219 Gastro-esophageal reflux disease without esophagitis: Secondary | ICD-10-CM | POA: Diagnosis not present

## 2021-09-04 DIAGNOSIS — M62830 Muscle spasm of back: Secondary | ICD-10-CM | POA: Diagnosis not present

## 2021-09-04 DIAGNOSIS — M545 Low back pain, unspecified: Secondary | ICD-10-CM | POA: Diagnosis not present

## 2021-09-04 DIAGNOSIS — M9901 Segmental and somatic dysfunction of cervical region: Secondary | ICD-10-CM | POA: Diagnosis not present

## 2021-09-04 DIAGNOSIS — M9903 Segmental and somatic dysfunction of lumbar region: Secondary | ICD-10-CM | POA: Diagnosis not present

## 2021-09-05 DIAGNOSIS — M545 Low back pain, unspecified: Secondary | ICD-10-CM | POA: Diagnosis not present

## 2021-09-05 DIAGNOSIS — M9903 Segmental and somatic dysfunction of lumbar region: Secondary | ICD-10-CM | POA: Diagnosis not present

## 2021-09-05 DIAGNOSIS — M9901 Segmental and somatic dysfunction of cervical region: Secondary | ICD-10-CM | POA: Diagnosis not present

## 2021-09-05 DIAGNOSIS — M62830 Muscle spasm of back: Secondary | ICD-10-CM | POA: Diagnosis not present

## 2021-09-07 DIAGNOSIS — Q612 Polycystic kidney, adult type: Secondary | ICD-10-CM | POA: Diagnosis not present

## 2021-09-07 DIAGNOSIS — I129 Hypertensive chronic kidney disease with stage 1 through stage 4 chronic kidney disease, or unspecified chronic kidney disease: Secondary | ICD-10-CM | POA: Diagnosis not present

## 2021-09-07 DIAGNOSIS — E1122 Type 2 diabetes mellitus with diabetic chronic kidney disease: Secondary | ICD-10-CM | POA: Diagnosis not present

## 2021-09-11 DIAGNOSIS — M9903 Segmental and somatic dysfunction of lumbar region: Secondary | ICD-10-CM | POA: Diagnosis not present

## 2021-09-11 DIAGNOSIS — M62830 Muscle spasm of back: Secondary | ICD-10-CM | POA: Diagnosis not present

## 2021-09-11 DIAGNOSIS — M545 Low back pain, unspecified: Secondary | ICD-10-CM | POA: Diagnosis not present

## 2021-09-11 DIAGNOSIS — M9901 Segmental and somatic dysfunction of cervical region: Secondary | ICD-10-CM | POA: Diagnosis not present

## 2021-09-12 DIAGNOSIS — M545 Low back pain, unspecified: Secondary | ICD-10-CM | POA: Diagnosis not present

## 2021-09-12 DIAGNOSIS — M9903 Segmental and somatic dysfunction of lumbar region: Secondary | ICD-10-CM | POA: Diagnosis not present

## 2021-09-12 DIAGNOSIS — M62830 Muscle spasm of back: Secondary | ICD-10-CM | POA: Diagnosis not present

## 2021-09-12 DIAGNOSIS — M9901 Segmental and somatic dysfunction of cervical region: Secondary | ICD-10-CM | POA: Diagnosis not present

## 2021-09-14 DIAGNOSIS — M62830 Muscle spasm of back: Secondary | ICD-10-CM | POA: Diagnosis not present

## 2021-09-14 DIAGNOSIS — M545 Low back pain, unspecified: Secondary | ICD-10-CM | POA: Diagnosis not present

## 2021-09-14 DIAGNOSIS — M9903 Segmental and somatic dysfunction of lumbar region: Secondary | ICD-10-CM | POA: Diagnosis not present

## 2021-09-14 DIAGNOSIS — M9901 Segmental and somatic dysfunction of cervical region: Secondary | ICD-10-CM | POA: Diagnosis not present

## 2021-09-18 DIAGNOSIS — M9901 Segmental and somatic dysfunction of cervical region: Secondary | ICD-10-CM | POA: Diagnosis not present

## 2021-09-18 DIAGNOSIS — M545 Low back pain, unspecified: Secondary | ICD-10-CM | POA: Diagnosis not present

## 2021-09-18 DIAGNOSIS — M62830 Muscle spasm of back: Secondary | ICD-10-CM | POA: Diagnosis not present

## 2021-09-18 DIAGNOSIS — M9903 Segmental and somatic dysfunction of lumbar region: Secondary | ICD-10-CM | POA: Diagnosis not present

## 2021-09-20 DIAGNOSIS — M9903 Segmental and somatic dysfunction of lumbar region: Secondary | ICD-10-CM | POA: Diagnosis not present

## 2021-09-20 DIAGNOSIS — M9901 Segmental and somatic dysfunction of cervical region: Secondary | ICD-10-CM | POA: Diagnosis not present

## 2021-09-20 DIAGNOSIS — M62830 Muscle spasm of back: Secondary | ICD-10-CM | POA: Diagnosis not present

## 2021-09-20 DIAGNOSIS — M545 Low back pain, unspecified: Secondary | ICD-10-CM | POA: Diagnosis not present

## 2021-09-21 DIAGNOSIS — M9903 Segmental and somatic dysfunction of lumbar region: Secondary | ICD-10-CM | POA: Diagnosis not present

## 2021-09-21 DIAGNOSIS — M62838 Other muscle spasm: Secondary | ICD-10-CM | POA: Diagnosis not present

## 2021-09-21 DIAGNOSIS — M9901 Segmental and somatic dysfunction of cervical region: Secondary | ICD-10-CM | POA: Diagnosis not present

## 2021-09-21 DIAGNOSIS — M542 Cervicalgia: Secondary | ICD-10-CM | POA: Diagnosis not present

## 2021-09-21 DIAGNOSIS — M545 Low back pain, unspecified: Secondary | ICD-10-CM | POA: Diagnosis not present

## 2021-09-21 DIAGNOSIS — M62830 Muscle spasm of back: Secondary | ICD-10-CM | POA: Diagnosis not present

## 2021-09-26 DIAGNOSIS — K633 Ulcer of intestine: Secondary | ICD-10-CM | POA: Diagnosis not present

## 2021-09-26 DIAGNOSIS — R194 Change in bowel habit: Secondary | ICD-10-CM | POA: Diagnosis not present

## 2021-09-26 DIAGNOSIS — K6389 Other specified diseases of intestine: Secondary | ICD-10-CM | POA: Diagnosis not present

## 2021-09-26 DIAGNOSIS — K625 Hemorrhage of anus and rectum: Secondary | ICD-10-CM | POA: Diagnosis not present

## 2021-10-26 ENCOUNTER — Other Ambulatory Visit: Payer: Self-pay | Admitting: Nephrology

## 2021-10-26 DIAGNOSIS — I129 Hypertensive chronic kidney disease with stage 1 through stage 4 chronic kidney disease, or unspecified chronic kidney disease: Secondary | ICD-10-CM | POA: Diagnosis not present

## 2021-10-26 DIAGNOSIS — Q612 Polycystic kidney, adult type: Secondary | ICD-10-CM

## 2021-10-26 DIAGNOSIS — E1122 Type 2 diabetes mellitus with diabetic chronic kidney disease: Secondary | ICD-10-CM | POA: Diagnosis not present

## 2021-10-30 ENCOUNTER — Ambulatory Visit
Admission: RE | Admit: 2021-10-30 | Discharge: 2021-10-30 | Disposition: A | Discharge status: Home / Self Care | Payer: BLUE CROSS/BLUE SHIELD | Source: Ambulatory Visit | Attending: Nephrology | Admitting: Nephrology

## 2021-10-30 DIAGNOSIS — Q612 Polycystic kidney, adult type: Secondary | ICD-10-CM

## 2021-10-30 DIAGNOSIS — N1330 Unspecified hydronephrosis: Secondary | ICD-10-CM | POA: Diagnosis not present

## 2022-01-01 DIAGNOSIS — I10 Essential (primary) hypertension: Secondary | ICD-10-CM | POA: Diagnosis not present

## 2022-01-01 DIAGNOSIS — E7800 Pure hypercholesterolemia, unspecified: Secondary | ICD-10-CM | POA: Diagnosis not present

## 2022-01-01 DIAGNOSIS — Q613 Polycystic kidney, unspecified: Secondary | ICD-10-CM | POA: Diagnosis not present

## 2022-01-01 DIAGNOSIS — E119 Type 2 diabetes mellitus without complications: Secondary | ICD-10-CM | POA: Diagnosis not present

## 2022-01-01 DIAGNOSIS — E291 Testicular hypofunction: Secondary | ICD-10-CM | POA: Diagnosis not present

## 2022-01-01 DIAGNOSIS — Z8249 Family history of ischemic heart disease and other diseases of the circulatory system: Secondary | ICD-10-CM | POA: Diagnosis not present

## 2022-05-02 DIAGNOSIS — E291 Testicular hypofunction: Secondary | ICD-10-CM | POA: Diagnosis not present

## 2022-05-02 DIAGNOSIS — I129 Hypertensive chronic kidney disease with stage 1 through stage 4 chronic kidney disease, or unspecified chronic kidney disease: Secondary | ICD-10-CM | POA: Diagnosis not present

## 2022-05-02 DIAGNOSIS — E1122 Type 2 diabetes mellitus with diabetic chronic kidney disease: Secondary | ICD-10-CM | POA: Diagnosis not present

## 2022-05-02 DIAGNOSIS — Q612 Polycystic kidney, adult type: Secondary | ICD-10-CM | POA: Diagnosis not present

## 2022-07-10 DIAGNOSIS — E119 Type 2 diabetes mellitus without complications: Secondary | ICD-10-CM | POA: Diagnosis not present

## 2022-07-10 DIAGNOSIS — N5201 Erectile dysfunction due to arterial insufficiency: Secondary | ICD-10-CM | POA: Diagnosis not present

## 2022-07-10 DIAGNOSIS — I10 Essential (primary) hypertension: Secondary | ICD-10-CM | POA: Diagnosis not present

## 2022-07-10 DIAGNOSIS — E7800 Pure hypercholesterolemia, unspecified: Secondary | ICD-10-CM | POA: Diagnosis not present

## 2022-07-12 IMAGING — CT CT RENAL STONE PROTOCOL
2 of 4 series · 16 of 46 positions shown, 18 images · non-contrast
Comparison: None.

CLINICAL DATA: Onset left flank pain at [DATE] a.m. last night.

EXAM:
CT ABDOMEN AND PELVIS WITHOUT CONTRAST
TECHNIQUE: Multidetector CT imaging of the abdomen and pelvis was performed
following the standard protocol without IV contrast.

[Series 3: ap without · axial · non-contrast · 0.78mm/px · z∈[-1168,-698]mm · 13 of 104 slices shown, 15 images]
[im 5/104  soft-tissue]
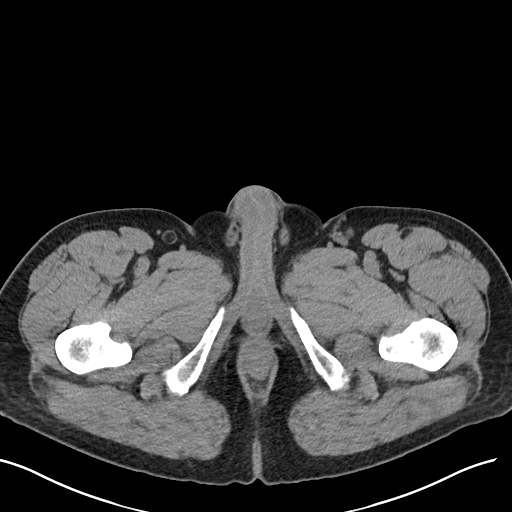
[im 5/104  bone]
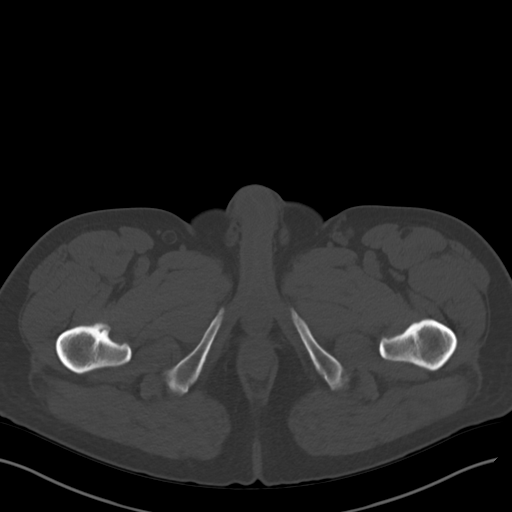
[im 15/104  soft-tissue]
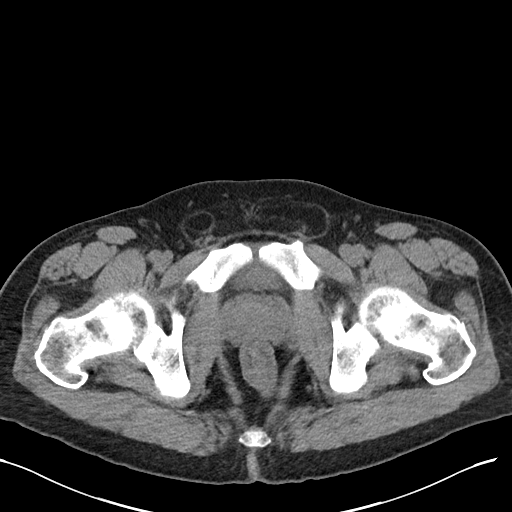
[im 20/104  soft-tissue]
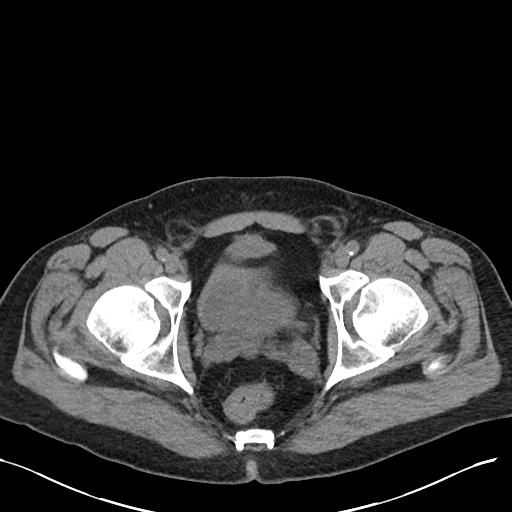
[im 30/104  soft-tissue]
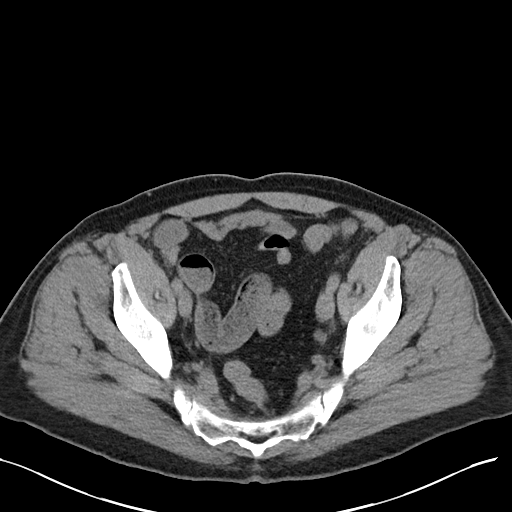
[im 35/104  soft-tissue]
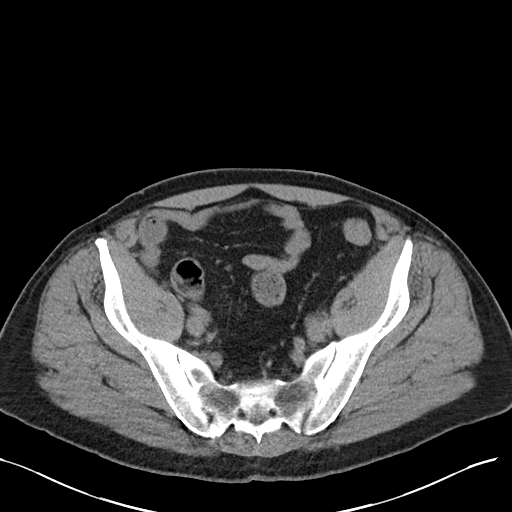
[im 45/104  soft-tissue]
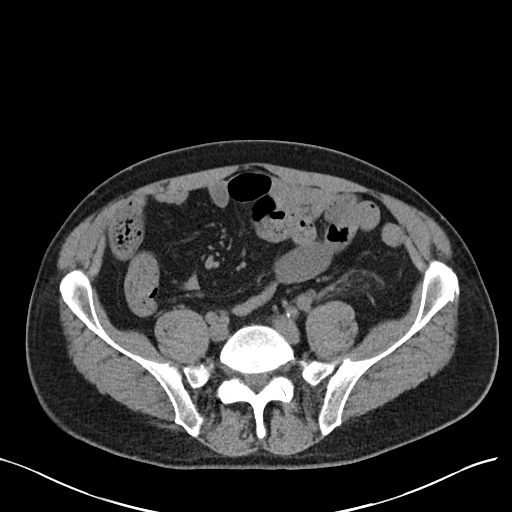
[im 54/104  soft-tissue]
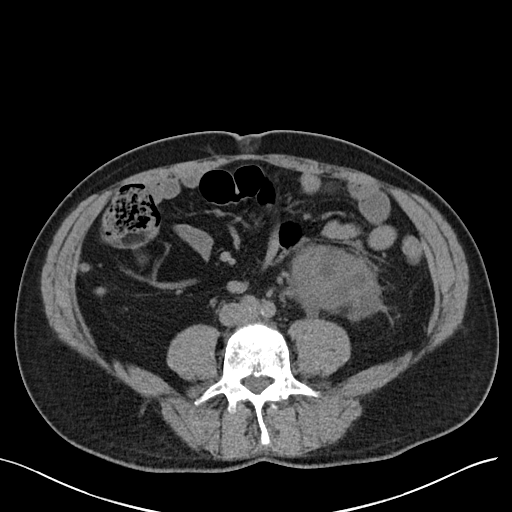
[im 59/104  soft-tissue]
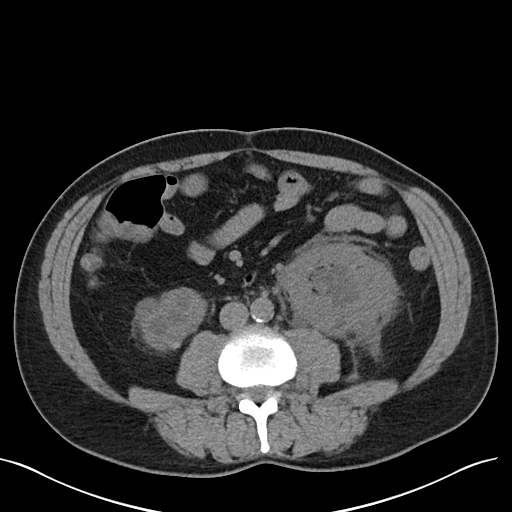
[im 69/104  soft-tissue]
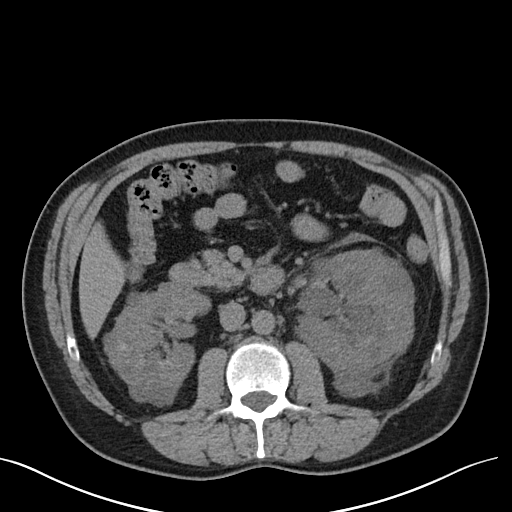
[im 69/104  bone]
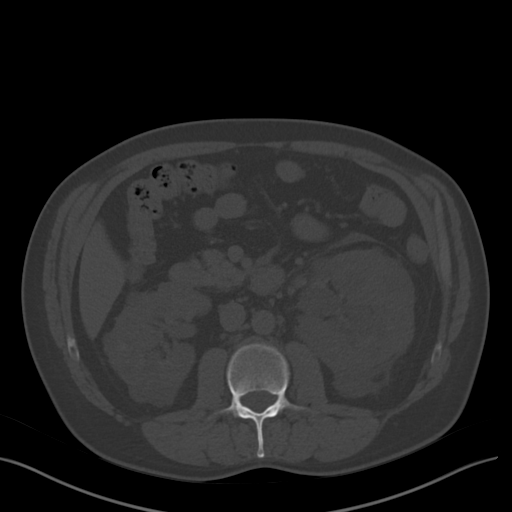
[im 74/104  soft-tissue]
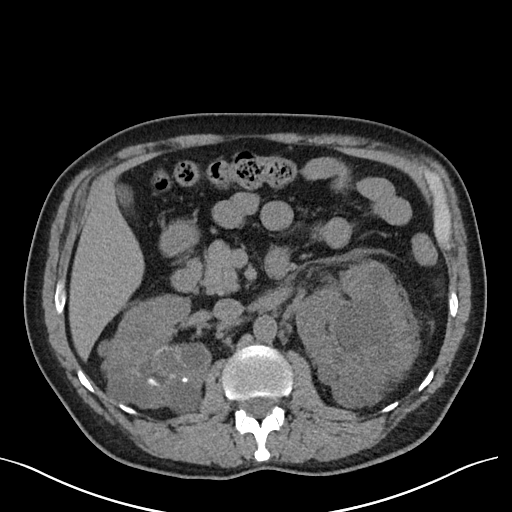
[im 84/104  soft-tissue]
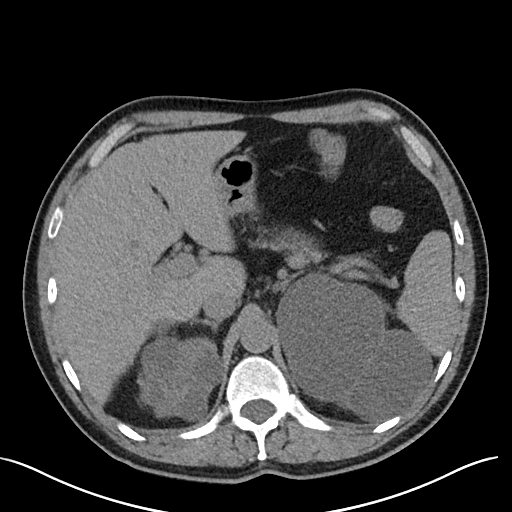
[im 89/104  soft-tissue]
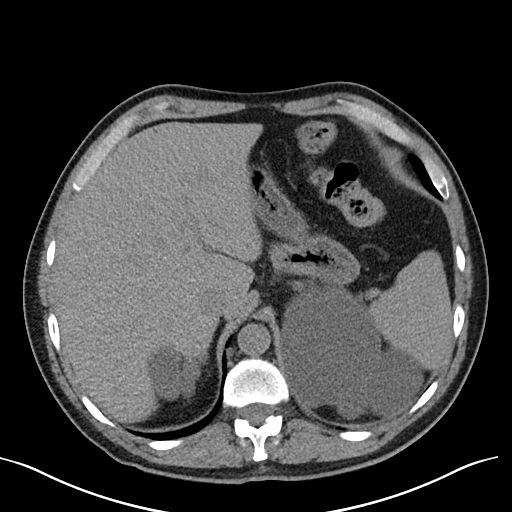
[im 99/104  soft-tissue]
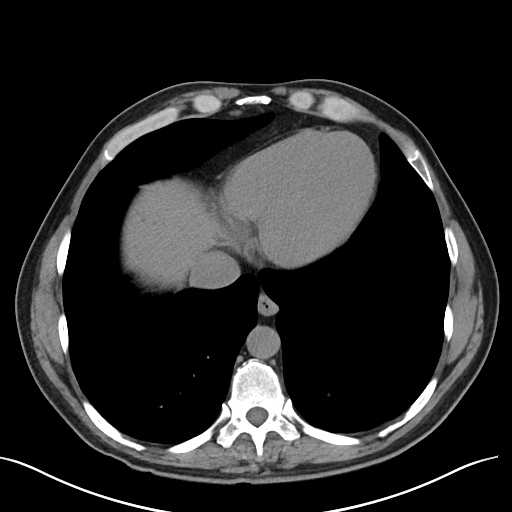

[Series 6: cor · coronal · 0.87mm/px · 3 of 103 slices shown]
[im 35/103  soft-tissue]
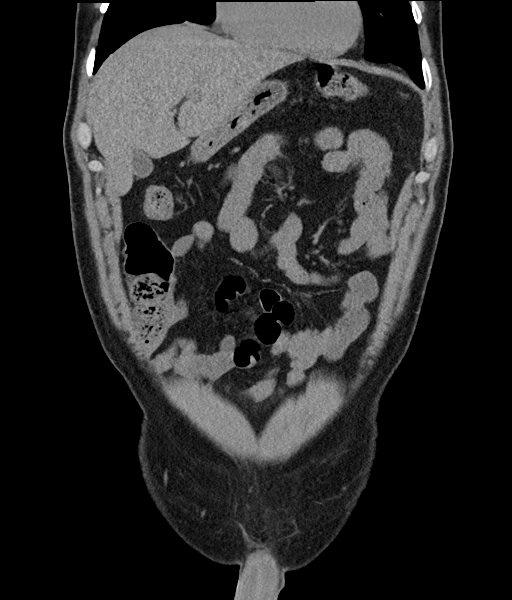
[im 46/103  soft-tissue]
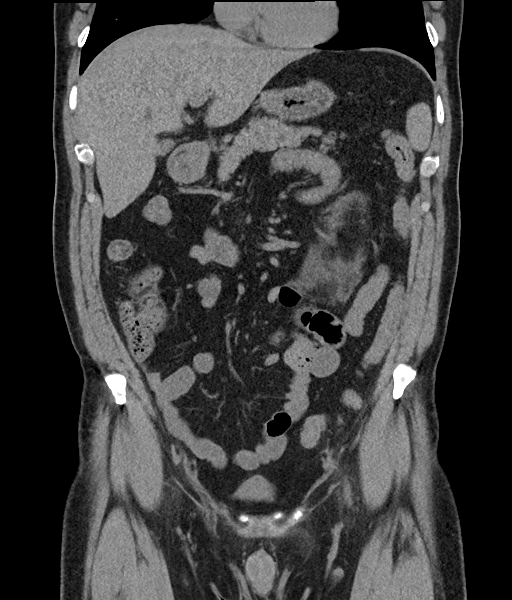
[im 57/103  soft-tissue]
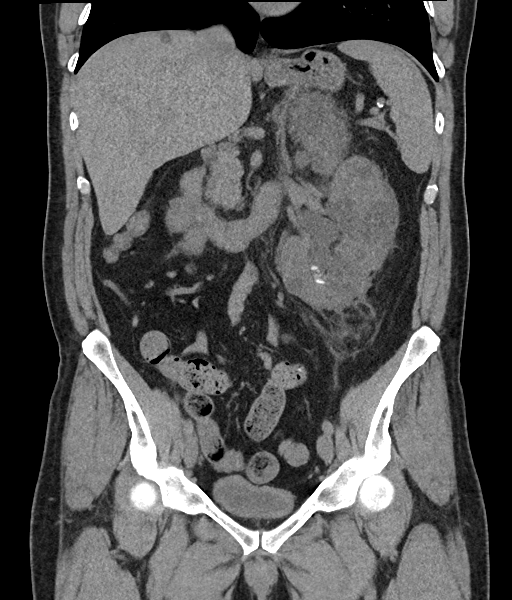

[16 of 46 positions shown; findings below may reference images not displayed]

FINDINGS: Lower chest: Lung bases clear.  No pleural or pericardial effusion.

Hepatobiliary: Innumerable small low attenuating lesions throughout
the liver are likely tiny cysts. Liver is otherwise negative. The
gallbladder is almost completely decompressed. No stones or evidence
of cholecystitis. Biliary tree negative.

Pancreas: Unremarkable. No pancreatic ductal dilatation or
surrounding inflammatory changes.

Spleen: Normal in size without focal abnormality.

Adrenals/Urinary Tract: The adrenal glands are negative.

The patient has innumerable bilateral renal cysts and both kidneys
are enlarged, both much worse on the left. There is stranding about
the inferior pole the left kidney and ureter and there is moderate
left hydronephrosis. No ureteral stone is identified. 2-3
nonobstructing stones are seen in both kidneys. Largest stone is in
the lower pole the left kidney measuring 1.5 cm. The bladder is
negative.

Stomach/Bowel: Stomach is within normal limits. Appendix appears
normal. No evidence of bowel wall thickening, distention, or
inflammatory changes.

Vascular/Lymphatic: Aortic atherosclerosis. No enlarged abdominal or
pelvic lymph nodes.

Reproductive: Prostate is unremarkable.

Other: Small fat containing umbilical hernia noted.

Musculoskeletal: No acute or focal abnormality.
IMPRESSION: Findings consistent with polycystic kidneys bilaterally. Multiple
small cysts in the liver also noted.

Stranding about the left kidney and ureter and moderate left
hydronephrosis. No obstructing stone is identified. Question recent
stone passage, ureteritis and/or pyelonephritis.

Bilateral nonobstructing renal stones.

Aortic Atherosclerosis (9710M-M9S.S).

## 2022-07-13 DIAGNOSIS — E119 Type 2 diabetes mellitus without complications: Secondary | ICD-10-CM | POA: Diagnosis not present

## 2022-07-13 DIAGNOSIS — E7800 Pure hypercholesterolemia, unspecified: Secondary | ICD-10-CM | POA: Diagnosis not present

## 2022-07-13 DIAGNOSIS — E291 Testicular hypofunction: Secondary | ICD-10-CM | POA: Diagnosis not present

## 2022-07-13 DIAGNOSIS — I10 Essential (primary) hypertension: Secondary | ICD-10-CM | POA: Diagnosis not present

## 2022-07-13 DIAGNOSIS — Z125 Encounter for screening for malignant neoplasm of prostate: Secondary | ICD-10-CM | POA: Diagnosis not present

## 2022-08-23 DIAGNOSIS — Q612 Polycystic kidney, adult type: Secondary | ICD-10-CM | POA: Diagnosis not present

## 2022-08-29 DIAGNOSIS — I129 Hypertensive chronic kidney disease with stage 1 through stage 4 chronic kidney disease, or unspecified chronic kidney disease: Secondary | ICD-10-CM | POA: Diagnosis not present

## 2022-08-29 DIAGNOSIS — Q612 Polycystic kidney, adult type: Secondary | ICD-10-CM | POA: Diagnosis not present

## 2022-08-29 DIAGNOSIS — E1122 Type 2 diabetes mellitus with diabetic chronic kidney disease: Secondary | ICD-10-CM | POA: Diagnosis not present

## 2023-11-05 DIAGNOSIS — N5201 Erectile dysfunction due to arterial insufficiency: Secondary | ICD-10-CM | POA: Diagnosis not present

## 2023-11-05 DIAGNOSIS — I10 Essential (primary) hypertension: Secondary | ICD-10-CM | POA: Diagnosis not present

## 2023-11-05 DIAGNOSIS — E119 Type 2 diabetes mellitus without complications: Secondary | ICD-10-CM | POA: Diagnosis not present

## 2023-11-05 DIAGNOSIS — Z125 Encounter for screening for malignant neoplasm of prostate: Secondary | ICD-10-CM | POA: Diagnosis not present

## 2023-11-05 DIAGNOSIS — E291 Testicular hypofunction: Secondary | ICD-10-CM | POA: Diagnosis not present

## 2023-11-05 DIAGNOSIS — E7800 Pure hypercholesterolemia, unspecified: Secondary | ICD-10-CM | POA: Diagnosis not present
